# Patient Record
Sex: Female | Born: 1965 | Race: Black or African American | Hispanic: No | Smoking: Never smoker
Health system: Southern US, Community
[De-identification: ages and names within clinical notes are randomized; demographics above are authoritative.]

## PROBLEM LIST (undated history)

## (undated) DIAGNOSIS — I10 Essential (primary) hypertension: Secondary | ICD-10-CM

## (undated) HISTORY — PX: ABDOMINAL HYSTERECTOMY: SHX81

---

## 2010-11-13 ENCOUNTER — Inpatient Hospital Stay (INDEPENDENT_AMBULATORY_CARE_PROVIDER_SITE_OTHER)
Admission: RE | Admit: 2010-11-13 | Discharge: 2010-11-13 | Disposition: A | Discharge status: Home / Self Care | Payer: BC Managed Care – PPO | Source: Ambulatory Visit | Attending: Emergency Medicine | Admitting: Emergency Medicine

## 2010-11-13 DIAGNOSIS — J40 Bronchitis, not specified as acute or chronic: Secondary | ICD-10-CM

## 2010-11-13 DIAGNOSIS — J309 Allergic rhinitis, unspecified: Secondary | ICD-10-CM

## 2012-07-24 ENCOUNTER — Emergency Department (HOSPITAL_COMMUNITY): Payer: BC Managed Care – PPO

## 2012-07-24 ENCOUNTER — Inpatient Hospital Stay (HOSPITAL_COMMUNITY)
Admission: EM | Admit: 2012-07-24 | Discharge: 2012-07-28 | DRG: 089 | Disposition: A | Discharge status: Home / Self Care | Payer: BC Managed Care – PPO | Attending: Internal Medicine | Admitting: Internal Medicine

## 2012-07-24 ENCOUNTER — Encounter (HOSPITAL_COMMUNITY): Payer: Self-pay | Admitting: Family Medicine

## 2012-07-24 DIAGNOSIS — J984 Other disorders of lung: Secondary | ICD-10-CM | POA: Diagnosis present

## 2012-07-24 DIAGNOSIS — E669 Obesity, unspecified: Secondary | ICD-10-CM | POA: Diagnosis present

## 2012-07-24 DIAGNOSIS — F411 Generalized anxiety disorder: Secondary | ICD-10-CM | POA: Diagnosis present

## 2012-07-24 DIAGNOSIS — J189 Pneumonia, unspecified organism: Principal | ICD-10-CM | POA: Diagnosis present

## 2012-07-24 DIAGNOSIS — Z79899 Other long term (current) drug therapy: Secondary | ICD-10-CM

## 2012-07-24 DIAGNOSIS — I10 Essential (primary) hypertension: Secondary | ICD-10-CM | POA: Diagnosis present

## 2012-07-24 DIAGNOSIS — J383 Other diseases of vocal cords: Secondary | ICD-10-CM | POA: Diagnosis present

## 2012-07-24 DIAGNOSIS — R0603 Acute respiratory distress: Secondary | ICD-10-CM | POA: Diagnosis present

## 2012-07-24 DIAGNOSIS — E876 Hypokalemia: Secondary | ICD-10-CM | POA: Diagnosis present

## 2012-07-24 DIAGNOSIS — R0609 Other forms of dyspnea: Secondary | ICD-10-CM

## 2012-07-24 DIAGNOSIS — Z6841 Body Mass Index (BMI) 40.0 and over, adult: Secondary | ICD-10-CM

## 2012-07-24 DIAGNOSIS — R062 Wheezing: Secondary | ICD-10-CM | POA: Diagnosis present

## 2012-07-24 HISTORY — DX: Essential (primary) hypertension: I10

## 2012-07-24 LAB — CBC WITH DIFFERENTIAL/PLATELET
HCT: 38 % (ref 36.0–46.0)
Hemoglobin: 12.6 g/dL (ref 12.0–15.0)
Lymphs Abs: 2.4 10*3/uL (ref 0.7–4.0)
MCH: 25.5 pg — ABNORMAL LOW (ref 26.0–34.0)
Monocytes Absolute: 0.5 10*3/uL (ref 0.1–1.0)
Monocytes Relative: 11 % (ref 3–12)
Neutro Abs: 1.7 10*3/uL (ref 1.7–7.7)
Neutrophils Relative %: 35 % — ABNORMAL LOW (ref 43–77)
RBC: 4.94 MIL/uL (ref 3.87–5.11)

## 2012-07-24 LAB — BASIC METABOLIC PANEL
BUN: 7 mg/dL (ref 6–23)
Chloride: 102 mEq/L (ref 96–112)
Glucose, Bld: 93 mg/dL (ref 70–99)
Potassium: 3.3 mEq/L — ABNORMAL LOW (ref 3.5–5.1)

## 2012-07-24 LAB — TSH: TSH: 1.386 u[IU]/mL (ref 0.350–4.500)

## 2012-07-24 MED ORDER — IPRATROPIUM BROMIDE 0.02 % IN SOLN
1.0000 mg | Freq: Once | RESPIRATORY_TRACT | Status: AC
  Administered 2012-07-24: 1 mg via RESPIRATORY_TRACT
  Filled 2012-07-24: qty 5

## 2012-07-24 MED ORDER — ACETAMINOPHEN 325 MG PO TABS: 650.0000 mg | ORAL_TABLET | Freq: Four times a day (QID) | ORAL | Status: DC | PRN

## 2012-07-24 MED ORDER — ONDANSETRON HCL 4 MG/2ML IJ SOLN: 4.0000 mg | Freq: Three times a day (TID) | INTRAMUSCULAR | Status: DC | PRN

## 2012-07-24 MED ORDER — LORAZEPAM 1 MG PO TABS
1.0000 mg | ORAL_TABLET | Freq: Once | ORAL | Status: AC
  Administered 2012-07-24: 1 mg via ORAL
  Filled 2012-07-24: qty 1

## 2012-07-24 MED ORDER — SODIUM CHLORIDE 0.9 % IV SOLN: INTRAVENOUS | Status: DC

## 2012-07-24 MED ORDER — HEPARIN SODIUM (PORCINE) 5000 UNIT/ML IJ SOLN
5000.0000 [IU] | Freq: Three times a day (TID) | INTRAMUSCULAR | Status: DC
  Administered 2012-07-24 – 2012-07-28 (×11): 5000 [IU] via SUBCUTANEOUS
  Filled 2012-07-24 (×15): qty 1

## 2012-07-24 MED ORDER — ALBUTEROL SULFATE (5 MG/ML) 0.5% IN NEBU
5.0000 mg | INHALATION_SOLUTION | Freq: Once | RESPIRATORY_TRACT | Status: AC
  Administered 2012-07-24: 5 mg via RESPIRATORY_TRACT
  Filled 2012-07-24: qty 1

## 2012-07-24 MED ORDER — ACETAMINOPHEN 650 MG RE SUPP: 650.0000 mg | Freq: Four times a day (QID) | RECTAL | Status: DC | PRN

## 2012-07-24 MED ORDER — GUAIFENESIN-DM 100-10 MG/5ML PO SYRP
5.0000 mL | ORAL_SOLUTION | ORAL | Status: DC | PRN
  Administered 2012-07-27: 5 mL via ORAL
  Filled 2012-07-24 (×2): qty 5

## 2012-07-24 MED ORDER — HYDRALAZINE HCL 20 MG/ML IJ SOLN
5.0000 mg | Freq: Four times a day (QID) | INTRAMUSCULAR | Status: DC | PRN
  Filled 2012-07-24: qty 0.25

## 2012-07-24 MED ORDER — ALBUTEROL SULFATE (5 MG/ML) 0.5% IN NEBU
2.5000 mg | INHALATION_SOLUTION | Freq: Four times a day (QID) | RESPIRATORY_TRACT | Status: DC
  Administered 2012-07-24 – 2012-07-26 (×8): 2.5 mg via RESPIRATORY_TRACT
  Filled 2012-07-24 (×9): qty 0.5

## 2012-07-24 MED ORDER — METHYLPREDNISOLONE SODIUM SUCC 125 MG IJ SOLR
80.0000 mg | Freq: Three times a day (TID) | INTRAMUSCULAR | Status: DC
  Administered 2012-07-24 – 2012-07-25 (×3): 80 mg via INTRAVENOUS
  Filled 2012-07-24 (×4): qty 1.28
  Filled 2012-07-24: qty 2
  Filled 2012-07-24: qty 1.28
  Filled 2012-07-24: qty 2

## 2012-07-24 MED ORDER — SODIUM CHLORIDE 0.9 % IJ SOLN
3.0000 mL | Freq: Two times a day (BID) | INTRAMUSCULAR | Status: DC
  Administered 2012-07-25 – 2012-07-27 (×6): 3 mL via INTRAVENOUS

## 2012-07-24 MED ORDER — OXYCODONE HCL 5 MG PO TABS: 5.0000 mg | ORAL_TABLET | ORAL | Status: DC | PRN

## 2012-07-24 MED ORDER — LEVOFLOXACIN 750 MG PO TABS
750.0000 mg | ORAL_TABLET | Freq: Once | ORAL | Status: AC
  Administered 2012-07-24: 750 mg via ORAL
  Filled 2012-07-24: qty 1

## 2012-07-24 MED ORDER — MAGNESIUM SULFATE IN D5W 10-5 MG/ML-% IV SOLN
1.0000 g | Freq: Once | INTRAVENOUS | Status: AC
  Administered 2012-07-24: 1 g via INTRAVENOUS
  Filled 2012-07-24: qty 100

## 2012-07-24 MED ORDER — AMLODIPINE BESYLATE 5 MG PO TABS
5.0000 mg | ORAL_TABLET | Freq: Every day | ORAL | Status: DC
  Administered 2012-07-24 – 2012-07-25 (×2): 5 mg via ORAL
  Filled 2012-07-24 (×3): qty 1

## 2012-07-24 MED ORDER — SODIUM CHLORIDE 0.9 % IV SOLN
INTRAVENOUS | Status: DC
  Administered 2012-07-24: 16:00:00 via INTRAVENOUS

## 2012-07-24 MED ORDER — FUROSEMIDE 10 MG/ML IJ SOLN
40.0000 mg | Freq: Once | INTRAMUSCULAR | Status: AC
  Administered 2012-07-24: 40 mg via INTRAVENOUS
  Filled 2012-07-24: qty 4

## 2012-07-24 MED ORDER — OSELTAMIVIR PHOSPHATE 75 MG PO CAPS
75.0000 mg | ORAL_CAPSULE | Freq: Two times a day (BID) | ORAL | Status: DC
  Administered 2012-07-24 – 2012-07-25 (×2): 75 mg via ORAL
  Filled 2012-07-24 (×3): qty 1

## 2012-07-24 MED ORDER — HYDROCODONE-ACETAMINOPHEN 7.5-500 MG/15ML PO SOLN
10.0000 mL | Freq: Once | ORAL | Status: AC
  Administered 2012-07-24: 10 mL via ORAL
  Filled 2012-07-24: qty 15

## 2012-07-24 MED ORDER — MORPHINE SULFATE 2 MG/ML IJ SOLN
1.0000 mg | INTRAMUSCULAR | Status: DC | PRN
  Administered 2012-07-24 – 2012-07-25 (×2): 2 mg via INTRAVENOUS
  Filled 2012-07-24 (×2): qty 1

## 2012-07-24 MED ORDER — ALBUTEROL SULFATE (5 MG/ML) 0.5% IN NEBU
2.5000 mg | INHALATION_SOLUTION | RESPIRATORY_TRACT | Status: DC | PRN
  Administered 2012-07-24: 2.5 mg via RESPIRATORY_TRACT
  Filled 2012-07-24 (×2): qty 0.5

## 2012-07-24 MED ORDER — IPRATROPIUM BROMIDE 0.02 % IN SOLN
0.5000 mg | Freq: Once | RESPIRATORY_TRACT | Status: AC
  Administered 2012-07-24: 0.5 mg via RESPIRATORY_TRACT
  Filled 2012-07-24: qty 2.5

## 2012-07-24 MED ORDER — ONDANSETRON HCL 4 MG/2ML IJ SOLN: 4.0000 mg | Freq: Four times a day (QID) | INTRAMUSCULAR | Status: DC | PRN

## 2012-07-24 MED ORDER — GUAIFENESIN ER 600 MG PO TB12
1200.0000 mg | ORAL_TABLET | Freq: Two times a day (BID) | ORAL | Status: DC
  Administered 2012-07-24 – 2012-07-28 (×8): 1200 mg via ORAL
  Filled 2012-07-24 (×12): qty 2

## 2012-07-24 MED ORDER — ALBUTEROL (5 MG/ML) CONTINUOUS INHALATION SOLN
10.0000 mg/h | INHALATION_SOLUTION | RESPIRATORY_TRACT | Status: DC
  Administered 2012-07-24: 10 mg/h via RESPIRATORY_TRACT
  Filled 2012-07-24: qty 20

## 2012-07-24 MED ORDER — PREDNISONE 20 MG PO TABS
60.0000 mg | ORAL_TABLET | Freq: Once | ORAL | Status: AC
  Administered 2012-07-24: 60 mg via ORAL
  Filled 2012-07-24: qty 3

## 2012-07-24 MED ORDER — IPRATROPIUM BROMIDE 0.02 % IN SOLN
0.5000 mg | Freq: Four times a day (QID) | RESPIRATORY_TRACT | Status: DC
  Administered 2012-07-25 (×2): 0.5 mg via RESPIRATORY_TRACT
  Filled 2012-07-24 (×4): qty 2.5

## 2012-07-24 MED ORDER — LEVOFLOXACIN IN D5W 750 MG/150ML IV SOLN
750.0000 mg | INTRAVENOUS | Status: DC
  Administered 2012-07-25: 750 mg via INTRAVENOUS
  Filled 2012-07-24 (×2): qty 150

## 2012-07-24 MED ORDER — ONDANSETRON HCL 4 MG PO TABS: 4.0000 mg | ORAL_TABLET | Freq: Four times a day (QID) | ORAL | Status: DC | PRN

## 2012-07-24 MED ORDER — ALBUTEROL SULFATE (5 MG/ML) 0.5% IN NEBU: 2.5000 mg | INHALATION_SOLUTION | Freq: Four times a day (QID) | RESPIRATORY_TRACT | Status: DC | PRN

## 2012-07-24 MED ORDER — POTASSIUM CHLORIDE CRYS ER 20 MEQ PO TBCR
40.0000 meq | EXTENDED_RELEASE_TABLET | Freq: Four times a day (QID) | ORAL | Status: AC
  Administered 2012-07-24 – 2012-07-25 (×2): 40 meq via ORAL
  Filled 2012-07-24 (×2): qty 2

## 2012-07-24 NOTE — ED Notes (Signed)
MD at bedside. 

## 2012-07-24 NOTE — ED Notes (Signed)
Moved to Pod A-2

## 2012-07-24 NOTE — ED Notes (Signed)
Returned from XRAY

## 2012-07-24 NOTE — ED Notes (Signed)
Admitting MD returned page. Coming to see patient

## 2012-07-24 NOTE — ED Provider Notes (Signed)
MSE was initiated and I personally evaluated the patient and placed orders (if any) at  2:12 PM on July 24, 2012.  Patient with grater than one week of cough, shortness of breath and wheezing. She is without history of asthma, medical history of hypertension only.  On my exam patient withshortness of breath, speaking in one to 2 word sentences and wheezing significantly.  CXR ordered along with a second albuterol treatment.  The patient appears stable so that the remainder of the MSE may be completed by another provider.   Dahlia Client Mardie Kellen, PA-C 07/24/12 1415

## 2012-07-24 NOTE — ED Notes (Signed)
Per pt sts cough, bronchitis, SOB x 1 week.

## 2012-07-24 NOTE — ED Notes (Signed)
Neb tx stopped due to pt HR increasing to 138. Pt experienced no relief with tx. Diminished inspiratory and expiatory wheezes auscultated bilaterally. Pt speaking in complete sentences. 99% RA.

## 2012-07-24 NOTE — ED Provider Notes (Signed)
History     CSN: [id number]  Arrival date & time 07/24/12  1315   First MD Initiated Contact with Patient 07/24/12 1410      Chief Complaint  Patient presents with  . Bronchitis    (Consider location/radiation/quality/duration/timing/severity/associated sxs/prior treatment) HPI  47 year old female with history of hypertension presents complaining of cough and shortness of breath. Patient reports for the past week she has had gradual onset of nonproductive cough, shortness of breath, and nasal congestion. She endorses posttussive emesis, and pleuritic chest pain. Symptom has been persistence, moderate in severity, not improved with home medication. She denies fever, chills, abdominal pain, back pain, or rash. She is a nonsmoker. She has no prior history of asthma or COPD. She denies any recent sick contact. Patient denies any recent surgery, prolonged bed rest, using birth control pill, prior history of PE or DVT, calf swelling or leg pain. There has been no other environmental changes.  Past Medical History  Diagnosis Date  . Hypertension     Past Surgical History  Procedure Date  . Abdominal hysterectomy     History reviewed. No pertinent family history.  History  Substance Use Topics  . Smoking status: Never Smoker   . Smokeless tobacco: Not on file  . Alcohol Use: No    OB History    Grav Para Term Preterm Abortions TAB SAB Ect Mult Living                  Review of Systems  Constitutional:       10 Systems reviewed and all are negative for acute change except as noted in the HPI.     Allergies  Penicillins  Home Medications   Current Outpatient Rx  Name  Route  Sig  Dispense  Refill  . LISINOPRIL-HYDROCHLOROTHIAZIDE PO   Oral   Take 2 tablets by mouth 2 (two) times daily.         Lenn Sink ALLERGY/COUGH PO   Oral   Take 30 mLs by mouth every 4 (four) hours as needed. For cough/cold           BP 164/109  Pulse 92  Temp 98.7 F (37.1 C)  (Oral)  Resp 20  SpO2 97%  Physical Exam  Nursing note and vitals reviewed. Constitutional: She appears well-developed and well-nourished. No distress.       Awake, alert, nontoxic appearance  HENT:  Head: Atraumatic.  Right Ear: External ear normal.  Left Ear: External ear normal.  Nose: Nose normal.  Mouth/Throat: Oropharynx is clear and moist. No oropharyngeal exudate.  Eyes: Conjunctivae normal are normal. Right eye exhibits no discharge. Left eye exhibits no discharge.  Neck: Neck supple. No JVD present.  Cardiovascular: Normal rate and regular rhythm.  Exam reveals no gallop and no friction rub.   No murmur heard. Pulmonary/Chest: She is in respiratory distress (patient with audible wheezing, speaking in 3-5 words per sentences, but without accessory muscle use.). She has wheezes. She has no rales. She exhibits no tenderness.  Abdominal: Soft. There is no tenderness. There is no rebound.  Musculoskeletal: She exhibits no edema and no tenderness.       ROM appears intact, no obvious focal weakness  Lymphadenopathy:    She has no cervical adenopathy.  Neurological:       Mental status and motor strength appears intact  Skin: No rash noted.  Psychiatric: She has a normal mood and affect.    ED Course  Procedures (including critical care  time)   Labs Reviewed  CBC WITH DIFFERENTIAL  BASIC METABOLIC PANEL   No results found.   No diagnosis found.  Results for orders placed during the hospital encounter of 07/24/12  CBC WITH DIFFERENTIAL      Component Value Range   WBC 4.9  4.0 - 10.5 K/uL   RBC 4.94  3.87 - 5.11 MIL/uL   Hemoglobin 12.6  12.0 - 15.0 g/dL   HCT 16.1  09.6 - 04.5 %   MCV 76.9 (*) 78.0 - 100.0 fL   MCH 25.5 (*) 26.0 - 34.0 pg   MCHC 33.2  30.0 - 36.0 g/dL   RDW 40.9  81.1 - 91.4 %   Platelets 206  150 - 400 K/uL   Neutrophils Relative 35 (*) 43 - 77 %   Neutro Abs 1.7  1.7 - 7.7 K/uL   Lymphocytes Relative 49 (*) 12 - 46 %   Lymphs Abs 2.4   0.7 - 4.0 K/uL   Monocytes Relative 11  3 - 12 %   Monocytes Absolute 0.5  0.1 - 1.0 K/uL   Eosinophils Relative 4  0 - 5 %   Eosinophils Absolute 0.2  0.0 - 0.7 K/uL   Basophils Relative 1  0 - 1 %   Basophils Absolute 0.0  0.0 - 0.1 K/uL  BASIC METABOLIC PANEL      Component Value Range   Sodium 139  135 - 145 mEq/L   Potassium 3.3 (*) 3.5 - 5.1 mEq/L   Chloride 102  96 - 112 mEq/L   CO2 25  19 - 32 mEq/L   Glucose, Bld 93  70 - 99 mg/dL   BUN 7  6 - 23 mg/dL   Creatinine, Ser 7.82  0.50 - 1.10 mg/dL   Calcium 9.2  8.4 - 95.6 mg/dL   GFR calc non Af Amer >90  >90 mL/min   GFR calc Af Amer >90  >90 mL/min   Dg Chest 2 View  07/24/2012  *RADIOLOGY REPORT*  Clinical Data: Wheezing.  Shortness of breath.  CHEST - 2 VIEW  Comparison: None.  Findings: Indistinct airspace opacity observed in the right upper lobe.  There is mild tortuosity of the thoracic aorta.  Heart size within normal limits.  Thoracic spondylosis is present.  No pleural effusion identified.  IMPRESSION:  1.  Indistinct airspace opacity in the right upper lobe, favoring early pneumonia.  Follow-up chest radiography in 4-6 weeks time is recommended to ensure clearance and exclude the unlikely possibility of malignancy. 2.  Thoracic spondylosis.   Original Report Authenticated By: Gaylyn Rong, M.D.     1. Community acquired pneumonia  MDM  Pt presents with URI sxs x 1 week. She is audibly wheezing, and in mild resp distress.  Has received 2 albuterol/atrovent treatment without improvement.  Will start continuous nebs, prednisone, obtain CXR and will continue to monitor.  No prior hx of asthma or COPD.  PERC negative.   3:30 PM Chest x-ray reviewed by me shows an indistinct airspace opacity in the right upper lobe favor an early pneumonia. Result discussed with patient.  Will treat pt with Levaquin.    4:31 PM Pt is hypertensive, tachypneic, and tachycardic with no improvement after receiving 2 albuterol/atrovent  treatment and hr long nebs.  Magnesium given, prednisone given.  My attending has also evaluated pt.  We both felt pt will benefit from admission for further management of her infection.    4:56 PM i have consulted with  [facility], who agrees to see pt in ER.  Request pt to be placed on obs, Team 3, tele.    Fayrene Helper, PA-C 07/25/12 1458

## 2012-07-24 NOTE — H&P (Signed)
[facility] History and Physical  Pammie Searles WGN:[id number] DOB: Jan 16, 1966 DOA: 07/24/2012  Referring physician:[place] PCP: Richelle Ito, FNP with a [facility], [place], [place]  Chief Complaint:   HPI: Grace Barker is a 47 y.o. female with past medical history only significant for hypertension and obesity. She came in to the hospital because of shortness of breath. 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 She said she's been having shortness of breath for the past 2-3 weeks, with followup respiratory symptoms including cough, wheezing and phlegm production. Symptoms comes and goes but for the past several days has been worsening. Patient reaches a point that she couldn't breathe well so she came in to the emergency department for further medical evaluation. When she was evaluated in the ED the intention was to discharge her home after she got levofloxacin, inhaled albuterol and steroids. But patient continues to have shortness of breath and respiratory distress she couldn't even complete sentences when she is talking. She never had history of COPD or asthma. She never been intubated before. Patient referred to that hospital for admission after the chest x-ray showed right upper lobe pneumonia.   Review of Systems:  Constitutional: negative for anorexia, fevers and sweats Eyes: negative for irritation, redness and visual disturbance Ears, nose, mouth, throat, and face: negative for earaches, epistaxis, nasal congestion and sore throat Respiratory: negative for cough, dyspnea on exertion, sputum and wheezing Cardiovascular: negative for chest pain, dyspnea, lower extremity edema, orthopnea, palpitations and syncope Gastrointestinal: negative for abdominal pain, constipation, diarrhea, melena, nausea and vomiting Genitourinary:negative for dysuria, frequency and hematuria Hematologic/lymphatic: negative for bleeding, easy bruising and  lymphadenopathy Musculoskeletal:negative for arthralgias, muscle weakness and stiff joints Neurological: negative for coordination problems, gait problems, headaches and weakness Endocrine: negative for diabetic symptoms including polydipsia, polyuria and weight loss Allergic/Immunologic: negative for anaphylaxis, hay fever and urticaria   Past Medical History  Diagnosis Date  . Hypertension    Past Surgical History  Procedure Date  . Abdominal hysterectomy    Social History:  reports that she has never smoked. She does not have any smokeless tobacco history on file. She reports that she does not drink alcohol. Her drug history not on file.   Allergies  Allergen Reactions  . Penicillins Rash    Family History  Problem Relation Age of Onset  . Diabetes Mother      Prior to Admission medications   Medication Sig Start Date End Date Taking? Authorizing Provider  LISINOPRIL-HYDROCHLOROTHIAZIDE PO Take 2 tablets by mouth 2 (two) times daily.   Yes Historical Provider, MD  Pseudoeph-Bromphen-DM (ROBITUSSIN ALLERGY/COUGH PO) Take 30 mLs by mouth every 4 (four) hours as needed. For cough/cold   Yes Historical Provider, MD   Physical Exam: Filed Vitals:   07/24/12 1340 07/24/12 1530 07/24/12 1841  BP: 164/109 158/86 160/85  Pulse: 92 101 107  Temp: 98.7 F (37.1 C)  97.7 F (36.5 C)  TempSrc: Oral    Resp: 20  24  SpO2: 97% 100% 99%   General appearance: alert, cooperative and no distress  Head: Normocephalic, without obvious abnormality, atraumatic  Eyes: conjunctivae/corneas clear. PERRL, EOM's intact. Fundi benign.  Nose: Nares normal. Septum midline. Mucosa normal. No drainage or sinus tenderness.  Throat: lips, mucosa, and tongue normal; teeth and gums normal  Neck: Supple, no masses, no cervical lymphadenopathy, no JVD appreciated, no meningeal signs Resp: Bilateral wheezing Chest wall: no tenderness  Cardio: regular rate and rhythm, S1, S2 normal,  no murmur, click,  rub or gallop  GI: soft, non-tender; bowel sounds normal; no masses, no organomegaly  Extremities: extremities normal, atraumatic, no cyanosis or edema  Skin: Skin color, texture, turgor normal. No rashes or lesions  Neurologic: Alert and oriented X 3, normal strength and tone. Normal symmetric reflexes. Normal coordination and gait  Labs on Admission:  Basic Metabolic Panel:  Lab 07/24/12 1610  NA 139  K 3.3*  CL 102  CO2 25  GLUCOSE 93  BUN 7  CREATININE 0.75  CALCIUM 9.2  MG --  PHOS --   Liver Function Tests: No results found for this basename: AST:5,ALT:5,ALKPHOS:5,BILITOT:5,PROT:5,ALBUMIN:5 in the last 168 hours No results found for this basename: LIPASE:5,AMYLASE:5 in the last 168 hours No results found for this basename: AMMONIA:5 in the last 168 hours CBC:  Lab 07/24/12 1525  WBC 4.9  NEUTROABS 1.7  HGB 12.6  HCT 38.0  MCV 76.9*  PLT 206   Cardiac Enzymes: No results found for this basename: CKTOTAL:5,CKMB:5,CKMBINDEX:5,TROPONINI:5 in the last 168 hours  BNP (last 3 results) No results found for this basename: PROBNP:3 in the last 8760 hours CBG: No results found for this basename: GLUCAP:5 in the last 168 hours  Radiological Exams on Admission: Dg Chest 2 View  07/24/2012  *RADIOLOGY REPORT*  Clinical Data: Wheezing.  Shortness of breath.  CHEST - 2 VIEW  Comparison: None.  Findings: Indistinct airspace opacity observed in the right upper lobe.  There is mild tortuosity of the thoracic aorta.  Heart size within normal limits.  Thoracic spondylosis is present.  No pleural effusion identified.  IMPRESSION:  1.  Indistinct airspace opacity in the right upper lobe, favoring early pneumonia.  Follow-up chest radiography in 4-6 weeks time is recommended to ensure clearance and exclude the unlikely possibility of malignancy. 2.  Thoracic spondylosis.   Original Report Authenticated By: Gaylyn Rong, M.D.     EKG: Independently  reviewed.  Assessment/Plan Principal Problem:  *CAP (community acquired pneumonia) Active Problems:  HTN (hypertension)  Respiratory distress  Wheezing   Pneumonia -Community acquired pneumonia, right upper lobe. -Patient started on Levaquin, supportive management with antitussives, mucolytics and oxygen as needed. -Patient did not report fever, but because of the severity of the symptoms I will rule out flu. -Started empirically on Tamiflu till the flu PCR comes back.  Respiratory distress -Patient is wheezing, she is using her accessory muscles for breathing. -As mentioned above she does not have history of intubation, COPD or asthma. -She is wheezing and, I placed her on inhaled bronchodilators and I will start IV Solu-Medrol. -Her oxygen saturation is above 97% on room air. Is not hypoxic or having chest pain, low probability for PE.  Hypertension -Patient is on lisinopril and hydrochlorothiazide. I will hold both, lisinopril can cause chronic cough -She does have trace pedal edema I gave her one dose of Lasix I started amlodipine.  Hypokalemia -Replete with oral supplementation.  Code Status: Full code Family Communication: Discussed with wife, son and daughter-in-law at bedside Disposition Plan: Inpatient, telemetry  Time spent: 70 minutes  [facility] Pager [phone]  If 7PM-7AM, please contact night-coverage [email or web address] Password [facility] 07/24/2012, 6:49 PM

## 2012-07-24 NOTE — ED Notes (Signed)
Patient transported to X-ray 

## 2012-07-24 NOTE — ED Provider Notes (Signed)
Medical screening examination/treatment/procedure(s) were performed by non-physician practitioner and as supervising physician I was immediately available for consultation/collaboration.   Daveena Elmore, MD 07/24/12 1657 

## 2012-07-24 NOTE — ED Notes (Signed)
Breathing tx interupted for xray.

## 2012-07-25 LAB — BASIC METABOLIC PANEL
BUN: 13 mg/dL (ref 6–23)
CO2: 22 mEq/L (ref 19–32)
Calcium: 9.5 mg/dL (ref 8.4–10.5)
Chloride: 102 mEq/L (ref 96–112)
Creatinine, Ser: 0.76 mg/dL (ref 0.50–1.10)

## 2012-07-25 LAB — CBC
HCT: 39 % (ref 36.0–46.0)
MCH: 25 pg — ABNORMAL LOW (ref 26.0–34.0)
MCHC: 32.6 g/dL (ref 30.0–36.0)
MCV: 76.9 fL — ABNORMAL LOW (ref 78.0–100.0)
Platelets: 225 10*3/uL (ref 150–400)
RDW: 15.4 % (ref 11.5–15.5)
WBC: 6.6 10*3/uL (ref 4.0–10.5)

## 2012-07-25 LAB — BLOOD GAS, ARTERIAL
Acid-base deficit: 4 mmol/L — ABNORMAL HIGH (ref 0.0–2.0)
Bicarbonate: 20.6 mEq/L (ref 20.0–24.0)
O2 Saturation: 98.3 %
Patient temperature: 98.6
TCO2: 21.8 mmol/L (ref 0–100)

## 2012-07-25 LAB — INFLUENZA PANEL BY PCR (TYPE A & B): H1N1 flu by pcr: NOT DETECTED

## 2012-07-25 LAB — HEMOGLOBIN A1C: Mean Plasma Glucose: 117 mg/dL — ABNORMAL HIGH (ref <117)

## 2012-07-25 MED ORDER — CHLORHEXIDINE GLUCONATE CLOTH 2 % EX PADS
6.0000 | MEDICATED_PAD | Freq: Every day | CUTANEOUS | Status: DC
  Administered 2012-07-25 – 2012-07-28 (×4): 6 via TOPICAL

## 2012-07-25 MED ORDER — ALPRAZOLAM 0.5 MG PO TABS
0.5000 mg | ORAL_TABLET | Freq: Three times a day (TID) | ORAL | Status: DC | PRN
  Administered 2012-07-25 – 2012-07-27 (×3): 0.5 mg via ORAL
  Filled 2012-07-25 (×3): qty 1

## 2012-07-25 MED ORDER — LEVOFLOXACIN IN D5W 750 MG/150ML IV SOLN: 750.0000 mg | INTRAVENOUS | Status: DC

## 2012-07-25 MED ORDER — BACITRACIN ZINC 500 UNIT/GM EX OINT
TOPICAL_OINTMENT | Freq: Two times a day (BID) | CUTANEOUS | Status: DC
  Administered 2012-07-25 – 2012-07-28 (×7): via TOPICAL
  Filled 2012-07-25 (×3): qty 15

## 2012-07-25 MED ORDER — AMLODIPINE BESYLATE 10 MG PO TABS
10.0000 mg | ORAL_TABLET | Freq: Every day | ORAL | Status: DC
  Administered 2012-07-26 – 2012-07-28 (×3): 10 mg via ORAL
  Filled 2012-07-25 (×3): qty 1

## 2012-07-25 MED ORDER — VANCOMYCIN HCL IN DEXTROSE 1-5 GM/200ML-% IV SOLN
1000.0000 mg | Freq: Two times a day (BID) | INTRAVENOUS | Status: DC
  Administered 2012-07-26: 1000 mg via INTRAVENOUS
  Filled 2012-07-25 (×3): qty 200

## 2012-07-25 MED ORDER — LORAZEPAM 2 MG/ML IJ SOLN
0.5000 mg | Freq: Once | INTRAMUSCULAR | Status: AC
  Administered 2012-07-25: 0.5 mg via INTRAVENOUS

## 2012-07-25 MED ORDER — METHYLPREDNISOLONE SODIUM SUCC 125 MG IJ SOLR
60.0000 mg | Freq: Two times a day (BID) | INTRAMUSCULAR | Status: DC
  Administered 2012-07-26: 60 mg via INTRAVENOUS
  Filled 2012-07-25 (×3): qty 0.96

## 2012-07-25 MED ORDER — LORAZEPAM 2 MG/ML IJ SOLN
INTRAMUSCULAR | Status: AC
  Administered 2012-07-25: 0.5 mg via INTRAVENOUS
  Filled 2012-07-25: qty 1

## 2012-07-25 MED ORDER — DEXTROSE 5 % IV SOLN
2.0000 g | Freq: Three times a day (TID) | INTRAVENOUS | Status: DC
  Administered 2012-07-25 – 2012-07-26 (×3): 2 g via INTRAVENOUS
  Filled 2012-07-25 (×5): qty 2

## 2012-07-25 MED ORDER — VANCOMYCIN HCL 10 G IV SOLR
2000.0000 mg | Freq: Once | INTRAVENOUS | Status: AC
  Administered 2012-07-25: 2000 mg via INTRAVENOUS
  Filled 2012-07-25: qty 2000

## 2012-07-25 NOTE — Progress Notes (Signed)
ANTIBIOTIC CONSULT NOTE - INITIAL  Pharmacy Consult:  Vancomycin Indication:  Suspected PNA  Allergies  Allergen Reactions  . Penicillins Rash    Patient Measurements: Height: 5\' 4"  (162.6 cm) Weight: 242 lb 8.1 oz (110 kg) IBW/kg (Calculated) : 54.7   Vital Signs: Temp: 98 F (36.7 C) (01/11 1200) Temp src: Oral (01/11 1200) BP: 133/81 mmHg (01/11 1200) Pulse Rate: 101  (01/11 0455) Intake/Output from previous day: 01/10 0701 - 01/11 0700 In: 560 [P.O.:560] Out: 800 [Urine:800] Intake/Output from this shift: Total I/O In: 483 [P.O.:480; I.V.:3] Out: -   Labs:  Basename 07/25/12 0550 07/24/12 1525  WBC 6.6 4.9  HGB 12.7 12.6  PLT 225 206  LABCREA -- --  CREATININE 0.76 0.75   Estimated Creatinine Clearance: 107.7 ml/min (by C-G formula based on Cr of 0.76). No results found for this basename: VANCOTROUGH:2,VANCOPEAK:2,VANCORANDOM:2,GENTTROUGH:2,GENTPEAK:2,GENTRANDOM:2,TOBRATROUGH:2,TOBRAPEAK:2,TOBRARND:2,AMIKACINPEAK:2,AMIKACINTROU:2,AMIKACIN:2, in the last 72 hours   Microbiology: Recent Results (from the past 720 hour(s))  MRSA PCR SCREENING     Status: Abnormal   Collection Time   07/25/12  4:45 AM      Component Value Range Status Comment   MRSA by PCR POSITIVE (*) NEGATIVE Final     Medical History: Past Medical History  Diagnosis Date  . Hypertension        Assessment: 89 YOF with chief complaints of shortness of breath for the past 2-3 weeks, cough, wheezing, and sputum production.  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  Pharmacy consulted to manage vancomycin.  She remains on levofloxacin and aztreonam.  Patient with good renal function.   Goal of Therapy:  Vancomycin trough level 15-20 mcg/ml   Plan:  - Vanc 2gm IV x 1, then 1gm IV Q12H - Continue Levaquin and Azactam as ordered - Monitor renal fxn, clinical course, vanc trough as indicated     Ghina Bittinger D. Laney Potash, PharmD, BCPS Pager:  [phone] 07/25/2012, 3:18  PM

## 2012-07-25 NOTE — Progress Notes (Signed)
[facility] Progress Note [place] TEAM 1 - Stepdown/ICU TEAM   Grace Barker ZOX:[id number] DOB: 1965/09/17 DOA: 07/24/2012 PCP: No primary provider on file.  Brief narrative: 47 y.o. female with past medical history only significant for hypertension and obesity. She came in to the hospital because of shortness of breath. 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 She said she's been having shortness of breath for the past 2-3 weeks, with followup respiratory symptoms including cough, wheezing and phlegm production. Symptoms come and go but for the past several days had been worsening. Patient reached a point that she couldn't breathe well so she came in to the emergency department for further medical evaluation. When she was evaluated in the ED the intention was to discharge her home after she got levofloxacin, inhaled albuterol and steroids. But patient continued to have shortness of breath and respiratory distress - she couldn't even complete sentences when she was talking. She never had history of COPD or asthma. She has never been intubated before.   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) Influenza screen negative - will broaden abx to provide HCAP coverage in that pt works in SNF  Acute respiratory distress - bronchospastic  ?component of vocal cord dysfxn +/- anxiety - low dose anxiolytic - maximal PPI tx for possible reflux assoc vocal cord irritation - cont nebs - follow sx - no evidence of signif CO2 retention on ABG  HTN BP reasonably controlled at this time - holding ACE and diuretic during acute illness  Hypokalemia Due to diuretic - resolved  Obesity  MRSA screen + Contact precautions   Code Status: FULL Family Communication: spoke w/ pt and boyfriend at bedside Disposition Plan: SDU  Consultants: none  Procedures: none  Antibiotics: Levaquin 07/24/2012 >> Maxipime 07/25/12 >> Vanc 07/25/12 >>  DVT  prophylaxis: Sub Q heparin  HPI/Subjective: Pt is wheezing.  She reports feeling "no better."  She is ale to complete full sentences.  RN reports that wheezing has waxed and waned, though pt states it has been "constant all day."  She denies n/v, abdom pain, or chest pain.  She admits to feeling very anxious.     Objective: Blood pressure 133/81, pulse 101, temperature 98 F (36.7 C), temperature source Oral, resp. rate 23, height 5\' 4"  (1.626 m), weight 110 kg (242 lb 8.1 oz), SpO2 98.00%.  Intake/Output Summary (Last 24 hours) at 07/25/12 1421 Last data filed at 07/25/12 1200  Gross per 24 hour  Intake   1043 ml  Output    800 ml  Net    243 ml     Exam: General: tachypneic w/ audible exp wheeze  Lungs: no focal crackles - wheeze appears most c/w upper air way wheeze  Cardiovascular: tachycardic but regular - no gallup or rub  Abdomen: obese, nontender, nondistended, soft, bowel sounds positive, no rebound, no ascites, no appreciable mass Extremities: No significant cyanosis, clubbing, or edema bilateral lower extremities  Data Reviewed: Basic Metabolic Panel:  Lab 07/25/12 8119 07/24/12 1525  NA 138 139  K 4.1 3.3*  CL 102 102  CO2 22 25  GLUCOSE 167* 93  BUN 13 7  CREATININE 0.76 0.75  CALCIUM 9.5 9.2  MG -- --  PHOS -- --   CBC:  Lab 07/25/12 0550 07/24/12 1525  WBC 6.6 4.9  NEUTROABS -- 1.7  HGB 12.7 12.6  HCT 39.0 38.0  MCV 76.9* 76.9*  PLT 225 206  Recent Results (from the past 240 hour(s))  MRSA PCR SCREENING     Status: Abnormal   Collection Time   07/25/12  4:45 AM      Component Value Range Status Comment   MRSA by PCR POSITIVE (*) NEGATIVE Final      Studies:  Recent x-ray studies have been reviewed in detail by the Attending Physician  Scheduled Meds:  Reviewed in detail by the Attending Physician   Lonia Blood, MD [facility] Office  [phone] Pager [phone]  On-Call/Text Page:      [email or web address]       password [email or web address]  If 7PM-7AM, please contact night-coverage [email or web address] Password [email or web address] 07/25/2012, 2:21 PM   LOS: 1 day

## 2012-07-25 NOTE — Progress Notes (Signed)
Grace Barker [birth year] [id number]  Brief Narrative: 46yo with pmh of hypertension and obesity admitted earlier this evening for cap and respiratory distress. Pt has no hx of asthma, COPD or smoking. She was unable to be discharged home from ED due to persistent wheezing despite prolonged nebulizer tx, IV magnesium sulfate and IV steroids. Was called to floor by RN because of continued wheezing and tachypnea.  Subjective: Pt states she feels 'about the same' from time of admission. She has multiple visitors in the room with whom she is talking. She denies any chest pain.  Objective: Filed Vitals:   07/24/12 2336  BP: 173/88  Pulse: 112  Temp: 97.3 F (36.3 C)  Resp: 24   General: awake, alert sitting upright in bed. Able to complete sentences without increased dyspnea CV: S1S2 RRR, no m/r/g Resp: diffuse expiratory wheeze, slightly tachypneic, no crackles GI: abdomen soft, NT/ND, BS+ Ext: no c/c/e Psych: AOx3, pleasant mood and affect  A/P:  1. Respiratory distress/CAP: Again, patient able to complete sentences without worsening symptoms. VSS. Pt is nontoxic appearing. Visitors have been asked to allow pt to rest. Will continue nebs q2h, IV steroids, empiric Levaquin. No evidence of volume overload on exam. No need to transfer to unit at this time. Continue to monitor.   Cordelia Pen, NP-C [facility] Service [facility]  pgr [phone]   Adendum @ 0330: Pt reporting increased sob and anxiety. Remains tachypneic. Will transfer to SDU for closer monitoring, check ABG. Would like to get CT chest, but will hold off for now until pt more comfortable.

## 2012-07-25 NOTE — Progress Notes (Signed)
Continues to c/o difficulty breathing "chest feels tight" Resp. 36/min. Expiratory wheezing heard throughout. Notified Belia Heman NP. Orders received. Will continue monitoring.

## 2012-07-26 ENCOUNTER — Inpatient Hospital Stay (HOSPITAL_COMMUNITY): Payer: BC Managed Care – PPO

## 2012-07-26 LAB — BASIC METABOLIC PANEL
BUN: 15 mg/dL (ref 6–23)
Chloride: 102 mEq/L (ref 96–112)
GFR calc Af Amer: >90 mL/min (ref >90)
GFR calc non Af Amer: >90 mL/min (ref >90)
Glucose, Bld: 136 mg/dL — ABNORMAL HIGH (ref 70–99)
Potassium: 4.6 mEq/L (ref 3.5–5.1)
Sodium: 136 mEq/L (ref 135–145)

## 2012-07-26 MED ORDER — PANTOPRAZOLE SODIUM 40 MG PO TBEC
40.0000 mg | DELAYED_RELEASE_TABLET | Freq: Two times a day (BID) | ORAL | Status: DC
  Administered 2012-07-26 – 2012-07-28 (×4): 40 mg via ORAL
  Filled 2012-07-26 (×4): qty 1

## 2012-07-26 MED ORDER — LORAZEPAM 2 MG/ML IJ SOLN
0.5000 mg | Freq: Once | INTRAMUSCULAR | Status: AC
  Administered 2012-07-26: 0.5 mg via INTRAVENOUS
  Filled 2012-07-26: qty 1

## 2012-07-26 MED ORDER — LABETALOL HCL 100 MG PO TABS
100.0000 mg | ORAL_TABLET | Freq: Two times a day (BID) | ORAL | Status: DC
  Administered 2012-07-26 – 2012-07-28 (×5): 100 mg via ORAL
  Filled 2012-07-26 (×6): qty 1

## 2012-07-26 MED ORDER — LIDOCAINE HCL 4 % IJ SOLN
5.0000 mL | Freq: Once | INTRAMUSCULAR | Status: AC
  Administered 2012-07-27: 5 mL
  Filled 2012-07-26: qty 5

## 2012-07-26 MED ORDER — FAMOTIDINE 40 MG PO TABS
40.0000 mg | ORAL_TABLET | Freq: Every day | ORAL | Status: DC
  Administered 2012-07-26 – 2012-07-28 (×3): 40 mg via ORAL
  Filled 2012-07-26 (×3): qty 1

## 2012-07-26 MED ORDER — RACEPINEPHRINE HCL 2.25 % IN NEBU
0.5000 mL | INHALATION_SOLUTION | Freq: Once | RESPIRATORY_TRACT | Status: AC
  Administered 2012-07-26: 0.5 mL via RESPIRATORY_TRACT
  Filled 2012-07-26: qty 0.5

## 2012-07-26 NOTE — ED Provider Notes (Signed)
Medical screening examination/treatment/procedure(s) were conducted as a shared visit with non-physician practitioner(s) and myself.  I personally evaluated the patient during the encounter Pt with prod cough, wheezing, sob, subj fever.  Wheezing bil, upper resp congestion, few scat rales. Persistent dyspnea/wheezing, will admit.   Suzi Roots, MD 07/26/12 [phone]

## 2012-07-26 NOTE — Progress Notes (Signed)
Patient being transferred to unit 5500 per MD order. Reportr called to [employer], RN, patient will transfer in wheel chair.

## 2012-07-26 NOTE — Progress Notes (Addendum)
[facility] Progress Note [place] TEAM 1 - Stepdown/ICU TEAM   Brei Pidgeon ZOX:[id number] DOB: [birth year] DOA: 07/24/2012 PCP: No primary provider on file.  Brief narrative: 47 y.o. female with past medical history only significant for hypertension and obesity. She came in to the hospital because of shortness of breath. 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 She said she's been having shortness of breath for the past 2-3 weeks, with followup respiratory symptoms including cough, wheezing and phlegm production. Symptoms come and go but for the past several days had been worsening. Patient reached a point that she couldn't breathe well so she came in to the emergency department for further medical evaluation. When she was evaluated in the ED the intention was to discharge her home after she got levofloxacin, inhaled albuterol and steroids. But patient continued to have shortness of breath and respiratory distress - she couldn't even complete sentences when she was talking. She never had history of COPD or asthma. She has never been intubated before.   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) Influenza screen negative - broadened abx yesterday to provide HCAP coverage in that pt works in SNF - f/u CXR today is w/o focal infiltrate - pt is afebrile and WBC count is normal, even on steroids - will d/c abx as I am no longer convinced she is suffering with a pulmonary infection  Acute respiratory distress - bronchospastic - ?Vocal Cord Instability Syndrome ?component of vocal cord dysfxn +/- anxiety - low dose anxiolytic - maximal PPI tx for possible reflux assoc vocal cord irritation - no evidence of signif CO2 retention on ABG - pt feels nebs "make it worse" - discussed w/ Pulmonologist - will maximize GERD tx, avoid ACE/ARB, d/c systemic and inhales steroids, and complete w/u to r/o sinus disease - would benefit from outpt spirometry once  back to baseline  HTN BP trending upward - avoid ACE/ARB due to concern for vocal cord dysfunction - add BB and follow  Hypokalemia Due to diuretic - resolved  Obesity  MRSA screen + Contact precautions   Code Status: FULL Family Communication: spoke w/ pt Disposition Plan: transfer to medical bed  Consultants: none  Procedures: none  Antibiotics: Levaquin 07/24/2012 >>1/12 Maxipime 07/25/12 >>1/12 Vanc 07/25/12 >>1/12  DVT prophylaxis: Sub Q heparin  HPI/Subjective: Pt continues to exhibit wheeze.  Wheeze is not present when sleeping, but returns as soon as pt awakens.  She feels that nebs make it worse.  She denies chest pain, n/v, or abdom pain.     Objective: Blood pressure 147/79, pulse 93, temperature 98 F (36.7 C), temperature source Oral, resp. rate 23, height 5\' 4"  (1.626 m), weight 110 kg (242 lb 8.1 oz), SpO2 95.00%.  Intake/Output Summary (Last 24 hours) at 07/26/12 0955 Last data filed at 07/26/12 0800  Gross per 24 hour  Intake   1533 ml  Output      0 ml  Net   1533 ml     Exam: General: tachypneic w/ audible exp wheeze  Lungs: no focal crackles - wheeze appears most c/w upper air way wheeze w/ clear air movement in periph fields to auscultation  Cardiovascular: tachycardic but regular - no gallup or rub  Abdomen: obese, nontender, nondistended, soft, bowel sounds positive, no rebound, no ascites, no appreciable mass Extremities: No significant cyanosis, clubbing, or edema bilateral lower extremities  Data Reviewed: Basic Metabolic Panel:  Lab 07/26/12 8119 07/25/12 0550 07/24/12 1525  NA 136 138 139  K 4.6 4.1 3.3*  CL 102 102 102  CO2 24 22 25   GLUCOSE 136* 167* 93  BUN 15 13 7   CREATININE 0.74 0.76 0.75  CALCIUM 9.8 9.5 9.2  MG -- -- --  PHOS -- -- --   CBC:  Lab 07/25/12 0550 07/24/12 1525  WBC 6.6 4.9  NEUTROABS -- 1.7  HGB 12.7 12.6  HCT 39.0 38.0  MCV 76.9* 76.9*  PLT 225 206    Recent Results (from the past 240  hour(s))  MRSA PCR SCREENING     Status: Abnormal   Collection Time   07/25/12  4:45 AM      Component Value Range Status Comment   MRSA by PCR POSITIVE (*) NEGATIVE Final      Studies:  Recent x-ray studies have been reviewed in detail by the Attending Physician  Scheduled Meds:  Reviewed in detail by the Attending Physician   Lonia Blood, MD [facility] Office  [phone] Pager [phone]  On-Call/Text Page:      [email or web address]      password [email or web address]  If 7PM-7AM, please contact night-coverage [email or web address] Password [facility] 07/26/2012, 9:55 AM   LOS: 2 days

## 2012-07-27 MED ORDER — FUROSEMIDE 10 MG/ML IJ SOLN
40.0000 mg | Freq: Once | INTRAMUSCULAR | Status: AC
  Administered 2012-07-27: 40 mg via INTRAVENOUS
  Filled 2012-07-27: qty 4

## 2012-07-27 MED ORDER — LIDOCAINE HCL 4 % IJ SOLN
5.0000 mL | INTRAMUSCULAR | Status: DC | PRN
  Administered 2012-07-27 – 2012-07-28 (×3): 5 mL
  Filled 2012-07-27 (×4): qty 5

## 2012-07-27 NOTE — Plan of Care (Signed)
Problem: Phase I Progression Outcomes Goal: Progress activity as tolerated unless otherwise ordered Outcome: Completed/Met Date Met:  07/27/12 Patient Ambulated to [facility] approx 261ft

## 2012-07-27 NOTE — Progress Notes (Addendum)
Patient had increased SOB from beginning of shift; respirations ranging from 28-35 but sating 96-97% on 2L [place], albuterol was given with no relief.  RR called.  MD notified and came up to floor to examine patient.  0.5mg  ativan, racenephrine, and lidocaine neb ordered and given.  SOB slowly decreased.  Patient is now resting comfortably with respirations WNL.  [place] [phone] at bedside is an LPN that requests that MD talk to him on am rounds.  Will continue to monitor patient.

## 2012-07-27 NOTE — Progress Notes (Signed)
Pt respiratory protocol,done. BBS clear throughout with wheezing coming from her vocal cord area. Pt is being given lidocaine treatments q4 prn. Tolerating well no further need for the albuterol since it doesn't help her issue. Left PRN dose for times of SOB and WOB with dyspnea from walking to strenuous activities.

## 2012-07-27 NOTE — Progress Notes (Signed)
Event: 2200:  Notified by RN That pt has had increasing RR since beginning of shift. Now w/ audible wheezes. Minimal relief w/ Albuterol neb. Ativan .5mg  ordered to be given IV. NP to bedside. Subjective: Pt unable to give specific information due to tachypnea.  Objective: Grace Barker is a 47 y/o female admitted on 07/24/12 after presenting to ED w/ c/o increasing SOB over last 2 to 3 weeks that had worsened over last couple of days prior to admission. CXR on admission showed RUL PNA. Pt has no h/o chronic respiratory illnesses. She has never been intubated. At bedside pt noted to be very tachypneac. BBS w/ upper airway exp wheezes. Some accessory muscle use.  Lower lung fields CTA. 02 sats 97% on 2L [place]. Current VS, BP-176/96, Recent temp 98.1, P-103, R-26. Pt had minimal relief w/ albuterol neb. Slightly more improved w/ nebulized racemic epinephrine. Pt continues to deny CP or other symptoms. Much improved after nebulized Lidocaine 4% (5ml). F/u w/ RN approx 1 hour after Lidocaine neb and pt was noted to be sleeping comfortably in no distress. Pt placed on telemetry and continuous pulse ox. Telemetry reveals NSR-ST.  Assessment/Plan: 1. Acute respiratory distress w/o hypoxia: Felt to be bronchospasm vs Vocal Cord Instability Syndrome. Resolved w/ Lidocaine neb and small dose of IV Ativan. Pt had similar episode early am. PCXR was w/o acute findings. ABG's have remained normal despite symptoms. Upon reassessment pt noted to be sleeping and no longer tachypneac.Cardiac telemetry reveals HR 79 (NSR). Will continue to monitor closely.  Leanne Chang, NP-C [facility] Pager [phone]

## 2012-07-27 NOTE — Progress Notes (Signed)
[facility] PROGRESS NOTE  Grace Barker XBJ:[id number] DOB: 20-Dec-1965 DOA: 07/24/2012 PCP: No primary provider on file.  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 Non toxic appearing.  Acute respiratory distress - bronchospastic - ?Vocal Cord Instability Syndrome  ?component of vocal cord dysfxn +/- anxiety - low dose anxiolytic - maximal PPI tx for possible reflux assoc vocal cord irritation - no evidence of signif CO2 retention on ABG - pt feels nebs "make it worse" - discussed w/ Pulmonologist -  maximize GERD tx, avoid ACE/ARB, d/c systemic and inhales steroids. CT neg for sinus disease. Episode during night see note. Relief with lidocain nebs and ativan. Currently sats >90 on 2L with mild increased work of breathing. Continue nebs, oxygen, monitoring sats.  would benefit from outpt spirometry once back to baseline.  HTN  Only fair control - SBP ranger135-176. DBP range 77-100. avoiding ACE/ARB due to concern for vocal cord dysfunction - continue BB and CCB Hypokalemia  Due to diuretic - resolved  Anxiety Likely related to #2. Xanax prn  Code Status: full Family Communication: at bedside Disposition Plan: home when ready   Consultants:  none  Procedures:  none  Antibiotics  Levaquin 07/24/2012 >>1/12  Maxipime 07/25/12 >>1/12  Vanc 07/25/12 >>1/12   HPI/Subjective: Lying in bed eyes closed no increased work of breathing. Arouses to verbal stimuli and expiratory wheezes with accessory muscle use. NAD  Objective: Filed Vitals:   07/26/12 2239 07/26/12 2248 07/27/12 0104 07/27/12 0612  BP:  152/100  135/77  Pulse:  99  75  Temp:  98.2 F (36.8 C)  98.2 F (36.8 C)  TempSrc:  Oral  Oral  Resp:  26   22  Height:      Weight:      SpO2: 98% 98% 96% 98%    Intake/Output Summary (Last 24 hours) at 07/27/12 0722 Last data filed at 07/27/12 0400  Gross per 24 hour  Intake    720 ml  Output      0 ml  Net    720 ml   Filed Weights   07/24/12 2014  Weight: 110 kg (242 lb 8.1 oz)    Exam:   General:  Awake alert somewhat anxious  Cardiovascular: RRR No MGR No LEE  Respiratory: normal effort when asleep. Mild increased work of breathing with accessory muscle use when awake. Good air movement. Wheezes particularly upper fields. Bases without crackles  Abdomen: obese, soft +BS non-tender to palpation  Data Reviewed: Basic Metabolic Panel:  Lab 07/26/12 3086 07/25/12 0550 07/24/12 1525  NA 136 138 139  K 4.6 4.1 3.3*  CL 102 102 102  CO2 24 22 25   GLUCOSE 136* 167* 93  BUN 15 13 7   CREATININE 0.74 0.76 0.75  CALCIUM 9.8 9.5 9.2  MG -- -- --  PHOS -- -- --   Liver Function Tests: No results found for this basename: AST:5,ALT:5,ALKPHOS:5,BILITOT:5,PROT:5,ALBUMIN:5 in the last 168 hours No results found for this basename: LIPASE:5,AMYLASE:5 in the last 168 hours No results found for this basename: AMMONIA:5 in the last 168 hours CBC:  Lab 07/25/12 0550 07/24/12 1525  WBC 6.6 4.9  NEUTROABS -- 1.7  HGB 12.7 12.6  HCT 39.0 38.0  MCV 76.9*  76.9*  PLT 225 206   Cardiac Enzymes: No results found for this basename: CKTOTAL:5,CKMB:5,CKMBINDEX:5,TROPONINI:5 in the last 168 hours BNP (last 3 results)  Basename 07/25/12 0600  PROBNP 99.2   CBG: No results found for this basename: GLUCAP:5 in the last 168 hours  Recent Results (from the past 240 hour(s))  MRSA PCR SCREENING     Status: Abnormal   Collection Time   07/25/12  4:45 AM      Component Value Range Status Comment   MRSA by PCR POSITIVE (*) NEGATIVE Final      Studies: Dg Chest Port 1 View  07/26/2012  *RADIOLOGY REPORT*  Clinical Data: Wheezing, shortness of breath  CHEST - 1 VIEW  Comparison:   07/24/2012  Findings: The heart size and mediastinal contours are within normal limits.  Both lungs are clear.  IMPRESSION: No active disease.   Original Report Authenticated By: Judie Petit. Miles Costain, M.D.    Ct Maxillofacial Wo Cm  07/26/2012  *RADIOLOGY REPORT*  Clinical Data: 47 year old female with cough, wheezing, congestion, sinusitis.  CT MAXILLOFACIAL WITHOUT CONTRAST  Technique:  Multidetector CT imaging of the maxillofacial structures was performed. Multiplanar CT image reconstructions were also generated.  Comparison: None.  Findings: Visualized orbits and scalp soft tissues are within normal limits.  Grossly negative visualized noncontrast brain parenchyma.  Adenoid and to a lesser extent tonsillar hypertrophy. Parapharyngeal and retropharyngeal spaces are within normal limits. Other visualized deep soft tissue spaces of the face are within normal limits.  Tympanic cavities and mastoids are clear.  Sphenoid sinuses are clear. Ethmoid air cells are clear. Frontal sinuses are hypoplastic and clear. Maxillary sinuses are clear.  Incidental torus palatinus and asymmetric calcification of the stylohyoid ligaments (greater on the right). No acute osseous abnormality identified.  IMPRESSION: 1.  Negative paranasal sinuses. 2.  Adenoid and tonsillar hypertrophy such as can be seen with URI.   Original Report Authenticated By: Erskine Speed, M.D.     Scheduled Meds:   . albuterol  2.5 mg Nebulization Q6H  . amLODipine  10 mg Oral Daily  . bacitracin   Topical BID  . Chlorhexidine Gluconate Cloth  6 each Topical Q0600  . famotidine  40 mg Oral Daily  . guaiFENesin  1,200 mg Oral BID  . heparin  5,000 Units Subcutaneous Q8H  . labetalol  100 mg Oral BID  . pantoprazole  40 mg Oral BID AC  . sodium chloride  3 mL Intravenous Q12H   Continuous Infusions:   Principal Problem:  *CAP (community acquired pneumonia) Active Problems:  HTN (hypertension)  Respiratory distress  Wheezing    Time spent: 35  minutes    [facility] M  [facility]  If 8PM-8AM, please contact night-coverage at [email or web address], password [facility] 07/27/2012, 7:22 AM  LOS: 3 days

## 2012-07-27 NOTE — Progress Notes (Signed)
Addendum  Patient seen and examined, chart and data base reviewed.  I agree with the above assessment and plan.  For full details please see Mrs. Melbourne Abts NP note.  Reported wheezing, continues to have sats more than 90% on room air.  Concerns about vocal cord dysfunction, lidocaineand Ativan.  Ambulate, wean off of oxygen, she is continued to have stridor to use racemic epinephrine when necessary.   Clint Lipps, MD [facility] Pager: [phone] 07/27/2012, 11:46 AM

## 2012-07-27 NOTE — Plan of Care (Signed)
Problem: Phase I Progression Outcomes Goal: O2 sats > or equal 90% or at baseline Outcome: Progressing Room air

## 2012-07-28 MED ORDER — INFLUENZA VIRUS VACC SPLIT PF IM SUSP: 0.5000 mL | INTRAMUSCULAR | Status: DC

## 2012-07-28 MED ORDER — LABETALOL HCL 100 MG PO TABS: 100.0000 mg | ORAL_TABLET | Freq: Two times a day (BID) | ORAL | Status: DC

## 2012-07-28 MED ORDER — AMLODIPINE BESYLATE 10 MG PO TABS: 10.0000 mg | ORAL_TABLET | Freq: Every day | ORAL | Status: DC

## 2012-07-28 MED ORDER — GUAIFENESIN-DM 100-10 MG/5ML PO SYRP: 5.0000 mL | ORAL_SOLUTION | ORAL | Status: DC | PRN

## 2012-07-28 MED ORDER — PANTOPRAZOLE SODIUM 40 MG PO TBEC: 40.0000 mg | DELAYED_RELEASE_TABLET | Freq: Two times a day (BID) | ORAL | Status: DC

## 2012-07-28 MED ORDER — FAMOTIDINE 40 MG PO TABS: 40.0000 mg | ORAL_TABLET | Freq: Every day | ORAL | Status: DC

## 2012-07-28 MED ORDER — INFLUENZA VIRUS VACC SPLIT PF IM SUSP
0.5000 mL | Freq: Once | INTRAMUSCULAR | Status: AC
  Administered 2012-07-28: 0.5 mL via INTRAMUSCULAR
  Filled 2012-07-28: qty 0.5

## 2012-07-28 MED ORDER — OXYCODONE HCL 5 MG PO TABS: 5.0000 mg | ORAL_TABLET | Freq: Four times a day (QID) | ORAL | Status: DC | PRN

## 2012-07-28 NOTE — Discharge Summary (Signed)
Physician Discharge Summary  Grace Barker ZOX:[id number] DOB: [birth year] DOA: 07/24/2012  PCP: none will refer to [facility] urgent care  Admit date: 07/24/2012 Discharge date: 07/28/2012  Time spent: 40  minutes  Recommendations for Outpatient Follow-up:  1. Follow up with [facility]   Discharge Diagnoses:  Principal Problem:  *CAP (community acquired pneumonia) Active Problems:  HTN (hypertension)  Respiratory distress  Wheezing   Discharge Condition: stable  Diet recommendation: regular  Filed Weights   07/24/12 2014  Weight: 110 kg (242 lb 8.1 oz)    History of present illness:   Grace Barker is a 47 y.o. female with past medical history only significant for hypertension and obesity. She came in to the hospital on 07/24/12 because of shortness of breath. 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 She said she's been having shortness of breath for the previous 2-3 weeks, with followup respiratory symptoms including cough, wheezing and phlegm production. Symptoms come and goe but for the previous several days had been worsening. Patient reached a point that she couldn't breathel so she came in to the emergency department for further medical evaluation. When she was evaluated in the ED the intention was to discharge her home after she got levofloxacin, inhaled albuterol and steroids. But patient continued to have shortness of breath and respiratory distress she couldn't even complete sentences when she is talking. She never had history of COPD or asthma. She never been intubated before. Patient referred to that hospital for admission after the chest x-ray showed right upper lobe pneumonia. Pt admitted to [facility] for treatment    Hospital Course:  Pneumonia - RUL - possibly healthcare acquired (works in [place])   Initially started on levaquin, and then broadened on 07/25/12 with  maxipime and vanc for HCAP. Repeat xray on 07/26/12 showed no infiltrate. Pt afebrile and  no white count even with steroids. Antibiotics discontinues on 07/26/12. Influenza screen negative  Pt remained afebrile with a normal white count during this hospitalization. At discharge she is  Non toxic appearing.  Acute respiratory distress - bronchospastic - ?Vocal Cord Instability Syndrome  ?component of vocal cord dysfxn +/- anxiety - low dose anxiolytic - maximal PPI tx for possible reflux assoc vocal cord irritation - no evidence of signif CO2 retention on ABG - pt feels nebs "make it worse" - discussed w/ Pulmonologist - maximize GERD tx, avoid ACE/ARB, d/c systemic and inhales steroids on 07/26/12. CT neg for sinus disease. Relief with lidocain nebs and ativan.  While sleeping pt exhibited no increased work of breathing or wheezes. On 07/27/12 ambulated 2102ft on room air and maintained sats >90%. At discharge sats >90% on room air. Recommend follow up with PCP for OP work up possible cord dysfunction  HTN  Fair control - SBP ranger135-176. DBP range 77-100. avoiding ACE/ARB due to concern for vocal cord dysfunction - continue BB and CCB  Hypokalemia  Due to diuretic - resolved  Anxiety  Likely related to #2. Xanax prn Resolved at discharge   Procedures:  none  Consultations:  none  Discharge Exam: Filed Vitals:   07/27/12 2005 07/27/12 2141 07/28/12 0319 07/28/12 0605  BP:  128/84  132/83  Pulse:  84  85  Temp:  98.5 F (36.9 C)  98.5 F (36.9 C)  TempSrc:  Oral  Oral  Resp:  24  24  Height:      Weight:      SpO2: 98% 95% 96% 95%    General: awake  alert NAD Cardiovascular: RRR No MGR no LEE Respiratory: normal effort BS somewhat coarse bases. Good air flow. Wheeze upper airway  Discharge Instructions  Discharge Orders    Future Orders Please Complete By Expires   Diet - low sodium heart healthy      Increase activity slowly      Call MD for:  persistant nausea and vomiting      Call MD for:  persistant dizziness or light-headedness          Medication List       As of 07/28/2012  9:55 AM    STOP taking these medications         LISINOPRIL-HYDROCHLOROTHIAZIDE PO      TAKE these medications         amLODipine 10 MG tablet   Commonly known as: NORVASC   Take 1 tablet (10 mg total) by mouth daily.      famotidine 40 MG tablet   Commonly known as: PEPCID   Take 1 tablet (40 mg total) by mouth daily.      guaiFENesin-dextromethorphan 100-10 MG/5ML syrup   Commonly known as: ROBITUSSIN DM   Take 5 mLs by mouth every 4 (four) hours as needed for cough.      labetalol 100 MG tablet   Commonly known as: NORMODYNE   Take 1 tablet (100 mg total) by mouth 2 (two) times daily.      oxyCODONE 5 MG immediate release tablet   Commonly known as: Oxy IR/ROXICODONE   Take 1 tablet (5 mg total) by mouth every 6 (six) hours as needed.      pantoprazole 40 MG tablet   Commonly known as: PROTONIX   Take 1 tablet (40 mg total) by mouth 2 (two) times daily before a meal.      ROBITUSSIN ALLERGY/COUGH PO   Take 30 mLs by mouth every 4 (four) hours as needed. For cough/cold           Follow-up Information    Follow up with [facility]. Schedule an appointment as soon as possible for a visit in 1 week.   Contact information:   [address] [place] [address]           The results of significant diagnostics from this hospitalization (including imaging, microbiology, ancillary and laboratory) are listed below for reference.    Significant Diagnostic Studies: Dg Chest 2 View  07/24/2012  *RADIOLOGY REPORT*  Clinical Data: Wheezing.  Shortness of breath.  CHEST - 2 VIEW  Comparison: None.  Findings: Indistinct airspace opacity observed in the right upper lobe.  There is mild tortuosity of the thoracic aorta.  Heart size within normal limits.  Thoracic spondylosis is present.  No pleural effusion identified.  IMPRESSION:  1.  Indistinct airspace opacity in the right upper lobe, favoring early pneumonia.  Follow-up chest  radiography in 4-6 weeks time is recommended to ensure clearance and exclude the unlikely possibility of malignancy. 2.  Thoracic spondylosis.   Original Report Authenticated By: Gaylyn Rong, M.D.    Dg Chest Port 1 View  07/26/2012  *RADIOLOGY REPORT*  Clinical Data: Wheezing, shortness of breath  CHEST - 1 VIEW  Comparison:  07/24/2012  Findings: The heart size and mediastinal contours are within normal limits.  Both lungs are clear.  IMPRESSION: No active disease.   Original Report Authenticated By: Judie Petit. Miles Costain, M.D.    Ct Maxillofacial Wo Cm  07/26/2012  *RADIOLOGY REPORT*  Clinical Data: 47 year old  female with cough, wheezing, congestion, sinusitis.  CT MAXILLOFACIAL WITHOUT CONTRAST  Technique:  Multidetector CT imaging of the maxillofacial structures was performed. Multiplanar CT image reconstructions were also generated.  Comparison: None.  Findings: Visualized orbits and scalp soft tissues are within normal limits.  Grossly negative visualized noncontrast brain parenchyma.  Adenoid and to a lesser extent tonsillar hypertrophy. Parapharyngeal and retropharyngeal spaces are within normal limits. Other visualized deep soft tissue spaces of the face are within normal limits.  Tympanic cavities and mastoids are clear.  Sphenoid sinuses are clear. Ethmoid air cells are clear. Frontal sinuses are hypoplastic and clear. Maxillary sinuses are clear.  Incidental torus palatinus and asymmetric calcification of the stylohyoid ligaments (greater on the right). No acute osseous abnormality identified.  IMPRESSION: 1.  Negative paranasal sinuses. 2.  Adenoid and tonsillar hypertrophy such as can be seen with URI.   Original Report Authenticated By: Erskine Speed, M.D.     Microbiology: Recent Results (from the past 240 hour(s))  MRSA PCR SCREENING     Status: Abnormal   Collection Time   07/25/12  4:45 AM      Component Value Range Status Comment   MRSA by PCR POSITIVE (*) NEGATIVE Final       Labs: Basic Metabolic Panel:  Lab 07/26/12 1191 07/25/12 0550 07/24/12 1525  NA 136 138 139  K 4.6 4.1 3.3*  CL 102 102 102  CO2 24 22 25   GLUCOSE 136* 167* 93  BUN 15 13 7   CREATININE 0.74 0.76 0.75  CALCIUM 9.8 9.5 9.2  MG -- -- --  PHOS -- -- --   Liver Function Tests: No results found for this basename: AST:5,ALT:5,ALKPHOS:5,BILITOT:5,PROT:5,ALBUMIN:5 in the last 168 hours No results found for this basename: LIPASE:5,AMYLASE:5 in the last 168 hours No results found for this basename: AMMONIA:5 in the last 168 hours CBC:  Lab 07/25/12 0550 07/24/12 1525  WBC 6.6 4.9  NEUTROABS -- 1.7  HGB 12.7 12.6  HCT 39.0 38.0  MCV 76.9* 76.9*  PLT 225 206   Cardiac Enzymes: No results found for this basename: CKTOTAL:5,CKMB:5,CKMBINDEX:5,TROPONINI:5 in the last 168 hours BNP: BNP (last 3 results)  Basename 07/25/12 0600  PROBNP 99.2   CBG: No results found for this basename: GLUCAP:5 in the last 168 hours     Signed:  Gwenyth Bender  [facility] 07/28/2012, 9:55 AM

## 2012-07-28 NOTE — Discharge Summary (Signed)
Addendum  Patient seen and examined, chart and data base reviewed.  I agree with the above assessment and plan.  For full details please see Mrs. Toya Smothers NP note.  Admitted for respiratory distress, questionable pneumonia.  I think she has pseudo-wheezing, the wheezing she has; is upper respiratory tract transferred sounds.  Oxygen saturation is > 95% on room air, there was concern about his VCD.  Consider ENT referral if patient continues to have symptoms, I think her wheezing is functional.   Clint Lipps, MD [facility] Pager: [phone] 07/28/2012, 2:41 PM

## 2012-07-28 NOTE — Progress Notes (Signed)
Galadriel Stgermaine to be D/C'd Home per MD order.  Discharge instructions reviewed and discussed with the patient, all questions and concerns answered. Copy of instructions and scripts given to patient.   Shave, Damonica  Home Medication Instructions [id number]   Printed on:07/28/12 1147  Medication Information                    Pseudoeph-Bromphen-DM (ROBITUSSIN ALLERGY/COUGH PO) Take 30 mLs by mouth every 4 (four) hours as needed. For cough/cold           amLODipine (NORVASC) 10 MG tablet Take 1 tablet (10 mg total) by mouth daily.           famotidine (PEPCID) 40 MG tablet Take 1 tablet (40 mg total) by mouth daily.           guaiFENesin-dextromethorphan (ROBITUSSIN DM) 100-10 MG/5ML syrup Take 5 mLs by mouth every 4 (four) hours as needed for cough.           labetalol (NORMODYNE) 100 MG tablet Take 1 tablet (100 mg total) by mouth 2 (two) times daily.           oxyCODONE (OXY IR/ROXICODONE) 5 MG immediate release tablet Take 1 tablet (5 mg total) by mouth every 6 (six) hours as needed.           pantoprazole (PROTONIX) 40 MG tablet Take 1 tablet (40 mg total) by mouth 2 (two) times daily before a meal.             Patients skin is clean, dry and intact no evidence of skin break down. IV site discontinued and catheter remains intact. Site without signs and symptoms of complications. Dressing and pressure applied.  Patient escorted to car by volunteers in a wheelchair,  no distress noted upon discharge.  Bing Quarry 07/28/2012 11:47 AM

## 2012-07-29 NOTE — Care Management Note (Signed)
    Page 1 of 1   07/29/2012     5:41:15 PM   CARE MANAGEMENT NOTE 07/29/2012  Patient:  Grace Barker, Grace Barker   Account Number:  [id number]  Date Initiated:  07/29/2012  Documentation initiated by:  Letha Cape  Subjective/Objective Assessment:   dx pna  admit     Action/Plan:   Anticipated DC Date:  07/28/2012   Anticipated DC Plan:  HOME/SELF CARE      DC Planning Services  CM consult      Choice offered to / List presented to:             Status of service:  Completed, signed off Medicare Important Message given?   (If response is "NO", the following Medicare IM given date fields will be blank) Date Medicare IM given:   Date Additional Medicare IM given:    Discharge Disposition:  HOME/SELF CARE  Per UR Regulation:  Reviewed for med. necessity/level of care/duration of stay  If discussed at Long Length of Stay Meetings, dates discussed:    Comments:

## 2013-09-02 ENCOUNTER — Telehealth: Payer: Self-pay | Admitting: *Deleted

## 2013-09-02 DIAGNOSIS — R002 Palpitations: Secondary | ICD-10-CM

## 2013-09-02 NOTE — Telephone Encounter (Signed)
Dr. Kateri PlummerMorrow from [facility] has patient with palpitations needing 24 hour Holter Monitor - per Dr. Graciela HusbandsKlein.

## 2014-01-18 ENCOUNTER — Emergency Department (HOSPITAL_COMMUNITY): Payer: No Typology Code available for payment source

## 2014-01-18 ENCOUNTER — Emergency Department (HOSPITAL_COMMUNITY)
Admission: EM | Admit: 2014-01-18 | Discharge: 2014-01-18 | Disposition: A | Discharge status: Home / Self Care | Payer: No Typology Code available for payment source | Attending: Emergency Medicine | Admitting: Emergency Medicine

## 2014-01-18 ENCOUNTER — Encounter (HOSPITAL_COMMUNITY): Payer: Self-pay | Admitting: Emergency Medicine

## 2014-01-18 DIAGNOSIS — Y92410 Unspecified street and highway as the place of occurrence of the external cause: Secondary | ICD-10-CM | POA: Insufficient documentation

## 2014-01-18 DIAGNOSIS — Z79899 Other long term (current) drug therapy: Secondary | ICD-10-CM | POA: Insufficient documentation

## 2014-01-18 DIAGNOSIS — Y9389 Activity, other specified: Secondary | ICD-10-CM | POA: Insufficient documentation

## 2014-01-18 DIAGNOSIS — S63509A Unspecified sprain of unspecified wrist, initial encounter: Secondary | ICD-10-CM | POA: Insufficient documentation

## 2014-01-18 DIAGNOSIS — I10 Essential (primary) hypertension: Secondary | ICD-10-CM | POA: Insufficient documentation

## 2014-01-18 DIAGNOSIS — S63501A Unspecified sprain of right wrist, initial encounter: Secondary | ICD-10-CM

## 2014-01-18 MED ORDER — TRAMADOL HCL 50 MG PO TABS: 50.0000 mg | ORAL_TABLET | Freq: Four times a day (QID) | ORAL | Status: DC | PRN
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
## 2014-01-18 NOTE — Discharge Instructions (Signed)
For pain control you may take:  800mg  of ibuprofen (that is usually 4 over the counter pills)  3 times a day (take with food) and acetaminophen 975mg  (this is 3 over the counter pills) four times a day. Do not drink alcohol or combine with other medications that have acetaminophen as an ingredient (Read the labels!).  For breakthrough pain you may take Tramadol. Do not drink alcohol drive or operate heavy machinery when taking Tramadol.  Do not hesitate to return to the Emergency Department for any new, worsening or concerning symptoms.   If you do not have a primary care doctor you can establish one at the   [facility]: [address] [place] [address] [phone]  After you establish care. Let them know you were seen in the emergency room. They must obtain records for further management.    Wrist Sprain with Rehab A sprain is an injury in which a ligament that maintains the proper alignment of a joint is partially or completely torn. The ligaments of the wrist are susceptible to sprains. Sprains are classified into three categories. Grade 1 sprains cause pain, but the tendon is not lengthened. Grade 2 sprains include a lengthened ligament because the ligament is stretched or partially ruptured. With grade 2 sprains there is still function, although the function may be diminished. Grade 3 sprains are characterized by a complete tear of the tendon or muscle, and function is usually impaired. SYMPTOMS   Pain tenderness, inflammation, and/or bruising (contusion) of the injury.  A "pop" or tear felt and/or heard at the time of injury.  Decreased wrist function. CAUSES  A wrist sprain occurs when a force is placed on one or more ligaments that is greater than it/they can withstand. Common mechanisms of injury include:  Catching a ball with you hands.  Repetitive and/ or strenuous extension or flexion of the wrist. RISK INCREASES WITH:  Previous wrist  injury.  Contact sports (boxing or wrestling).  Activities in which falling is common.  Poor strength and flexibility.  Improperly fitted or padded protective equipment. PREVENTION  Warm up and stretch properly before activity.  Allow for adequate recovery between workouts.  Maintain physical fitness:  Strength, flexibility, and endurance.  Cardiovascular fitness.  Protect the wrist joint by limiting its motion with the use of taping, braces, or splints.  Protect the wrist after injury for 6 to 12 months. PROGNOSIS  The prognosis for wrist sprains depends on the degree of injury. Grade 1 sprains require 2 to 6 weeks of treatment. Grade 2 sprains require 6 to 8 weeks of treatment, and grade 3 sprains require up to 12 weeks.  RELATED COMPLICATIONS   Prolonged healing time, if improperly treated or re-injured.  Recurrent symptoms that result in a chronic problem.  Injury to nearby structures (bone, cartilage, nerves, or tendons).  Arthritis of the wrist.  Inability to compete in athletics at a high level.  Wrist stiffness or weakness.  Progression to a complete rupture of the ligament. TREATMENT  Treatment initially involves resting from any activities that aggravate the symptoms, and the use of ice and medications to help reduce pain and inflammation. Your caregiver may recommend immobilizing the wrist for a period of time in order to reduce stress on the ligament and allow for healing. After immobilization it is important to perform strengthening and stretching exercises to help regain strength and a full range of motion. These exercises may be completed at home or with a therapist. Surgery  is not usually required for wrist sprains, unless the ligament has been ruptured (grade 3 sprain). MEDICATION   If pain medication is necessary, then nonsteroidal anti-inflammatory medications, such as aspirin and ibuprofen, or other minor pain relievers, such as acetaminophen, are  often recommended.  Do not take pain medication for 7 days before surgery.  Prescription pain relievers may be given if deemed necessary by your caregiver. Use only as directed and only as much as you need. HEAT AND COLD  Cold treatment (icing) relieves pain and reduces inflammation. Cold treatment should be applied for 10 to 15 minutes every 2 to 3 hours for inflammation and pain and immediately after any activity that aggravates your symptoms. Use ice packs or massage the area with a piece of ice (ice massage).  Heat treatment may be used prior to performing the stretching and strengthening activities prescribed by your caregiver, physical therapist, or athletic trainer. Use a heat pack or soak your injury in warm water. SEEK MEDICAL CARE IF:  Treatment seems to offer no benefit, or the condition worsens.  Any medications produce adverse side effects. EXERCISES RANGE OF MOTION (ROM) AND STRETCHING EXERCISES - Wrist Sprain  These exercises may help you when beginning to rehabilitate your injury. Your symptoms may resolve with or without further involvement from your physician, physical therapist or athletic trainer. While completing these exercises, remember:   Restoring tissue flexibility helps normal motion to return to the joints. This allows healthier, less painful movement and activity.  An effective stretch should be held for at least 30 seconds.  A stretch should never be painful. You should only feel a gentle lengthening or release in the stretched tissue. RANGE OF MOTION - Wrist Flexion, Active-Assisted  Extend your right / left elbow with your fingers pointing down.*  Gently pull the back of your hand towards you until you feel a gentle stretch on the top of your forearm.  Hold this position for __________ seconds. Repeat __________ times. Complete this exercise __________ times per day.  *If directed by your physician, physical therapist or athletic trainer, complete this  stretch with your elbow bent rather than extended. RANGE OF MOTION - Wrist Extension, Active-Assisted  Extend your right / left elbow and turn your palm upwards.*  Gently pull your palm/fingertips back so your wrist extends and your fingers point more toward the ground.  You should feel a gentle stretch on the inside of your forearm.  Hold this position for __________ seconds. Repeat __________ times. Complete this exercise __________ times per day. *If directed by your physician, physical therapist or athletic trainer, complete this stretch with your elbow bent, rather than extended. RANGE OF MOTION - Supination, Active  Stand or sit with your elbows at your side. Bend your right / left elbow to 90 degrees.  Turn your palm upward until you feel a gentle stretch on the inside of your forearm.  Hold this position for __________ seconds. Slowly release and return to the starting position. Repeat __________ times. Complete this stretch __________ times per day.  RANGE OF MOTION - Pronation, Active  Stand or sit with your elbows at your side. Bend your right / left elbow to 90 degrees.  Turn your palm downward until you feel a gentle stretch on the top of your forearm.  Hold this position for __________ seconds. Slowly release and return to the starting position. Repeat __________ times. Complete this stretch __________ times per day.  STRETCH - Wrist Flexion  Place the back of  your right / left hand on a tabletop leaving your elbow slightly bent. Your fingers should point away from your body.  Gently press the back of your hand down onto the table by straightening your elbow. You should feel a stretch on the top of your forearm.  Hold this position for __________ seconds. Repeat __________ times. Complete this stretch __________ times per day.  STRETCH - Wrist Extension  Place your right / left fingertips on a tabletop leaving your elbow slightly bent. Your fingers should point  backwards.  Gently press your fingers and palm down onto the table by straightening your elbow. You should feel a stretch on the inside of your forearm.  Hold this position for __________ seconds. Repeat __________ times. Complete this stretch __________ times per day.  STRENGTHENING EXERCISES - Wrist Sprain These exercises may help you when beginning to rehabilitate your injury. They may resolve your symptoms with or without further involvement from your physician, physical therapist or athletic trainer. While completing these exercises, remember:   Muscles can gain both the endurance and the strength needed for everyday activities through controlled exercises.  Complete these exercises as instructed by your physician, physical therapist or athletic trainer. Progress with the resistance and repetition exercises only as your caregiver advises. STRENGTH - Wrist Flexors  Sit with your right / left forearm palm-up and fully supported. Your elbow should be resting below the height of your shoulder. Allow your wrist to extend over the edge of the surface.  Loosely holding a __________ weight or a piece of rubber exercise band/tubing, slowly curl your hand up toward your forearm.  Hold this position for __________ seconds. Slowly lower the wrist back to the starting position in a controlled manner. Repeat __________ times. Complete this exercise __________ times per day.  STRENGTH - Wrist Extensors  Sit with your right / left forearm palm-down and fully supported. Your elbow should be resting below the height of your shoulder. Allow your wrist to extend over the edge of the surface.  Loosely holding a __________ weight or a piece of rubber exercise band/tubing, slowly curl your hand up toward your forearm.  Hold this position for __________ seconds. Slowly lower the wrist back to the starting position in a controlled manner. Repeat __________ times. Complete this exercise __________ times per  day.  STRENGTH - Ulnar Deviators  Stand with a ____________________ weight in your right / left hand, or sit holding on to the rubber exercise band/tubing with your opposite arm supported.  Move your wrist so that your pinkie travels toward your forearm and your thumb moves away from your forearm.  Hold this position for __________ seconds and then slowly lower the wrist back to the starting position. Repeat __________ times. Complete this exercise __________ times per day STRENGTH - Radial Deviators  Stand with a ____________________ weight in your  right / left hand, or sit holding on to the rubber exercise band/tubing with your arm supported.  Raise your hand upward in front of you or pull up on the rubber tubing.  Hold this position for __________ seconds and then slowly lower the wrist back to the starting position. Repeat __________ times. Complete this exercise __________ times per day. STRENGTH - Forearm Supinators  Sit with your right / left forearm supported on a table, keeping your elbow below shoulder height. Rest your hand over the edge, palm down.  Gently grip a hammer or a soup ladle.  Without moving your elbow, slowly turn your palm and  hand upward to a "thumbs-up" position.  Hold this position for __________ seconds. Slowly return to the starting position. Repeat __________ times. Complete this exercise __________ times per day.  STRENGTH - Forearm Pronators  Sit with your right / left forearm supported on a table, keeping your elbow below shoulder height. Rest your hand over the edge, palm up.  Gently grip a hammer or a soup ladle.  Without moving your elbow, slowly turn your palm and hand upward to a "thumbs-up" position.  Hold this position for __________ seconds. Slowly return to the starting position. Repeat __________ times. Complete this exercise __________ times per day.  STRENGTH - Grip  Grasp a tennis ball, a dense sponge, or a large, rolled sock in  your hand.  Squeeze as hard as you can without increasing any pain.  Hold this position for __________ seconds. Release your grip slowly. Repeat __________ times. Complete this exercise __________ times per day.  Document Released: 07/01/2005 Document Revised: 09/23/2011 Document Reviewed: 10/13/2008 [facility] Patient Information 2015 [place], [place]. This information is not intended to replace advice given to you by your health care provider. Make sure you discuss any questions you have with your health care provider.
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
## 2014-01-19 NOTE — ED Provider Notes (Signed)
Medical screening examination/treatment/procedure(s) were performed by non-physician practitioner and as supervising physician I was immediately available for consultation/collaboration.   EKG Interpretation None       Anais Koenen, MD 01/19/14 0207 

## 2015-01-21 ENCOUNTER — Encounter (HOSPITAL_COMMUNITY): Payer: Self-pay

## 2015-01-21 ENCOUNTER — Inpatient Hospital Stay (HOSPITAL_COMMUNITY)
Admission: EM | Admit: 2015-01-21 | Discharge: 2015-01-23 | DRG: 189 | Disposition: A | Discharge status: Home / Self Care | Payer: BLUE CROSS/BLUE SHIELD | Attending: Pulmonary Disease | Admitting: Pulmonary Disease

## 2015-01-21 ENCOUNTER — Emergency Department (HOSPITAL_COMMUNITY): Payer: BLUE CROSS/BLUE SHIELD

## 2015-01-21 DIAGNOSIS — D509 Iron deficiency anemia, unspecified: Secondary | ICD-10-CM | POA: Diagnosis present

## 2015-01-21 DIAGNOSIS — R0600 Dyspnea, unspecified: Secondary | ICD-10-CM

## 2015-01-21 DIAGNOSIS — Z6841 Body Mass Index (BMI) 40.0 and over, adult: Secondary | ICD-10-CM

## 2015-01-21 DIAGNOSIS — J383 Other diseases of vocal cords: Secondary | ICD-10-CM | POA: Diagnosis present

## 2015-01-21 DIAGNOSIS — K449 Diaphragmatic hernia without obstruction or gangrene: Secondary | ICD-10-CM | POA: Diagnosis present

## 2015-01-21 DIAGNOSIS — I10 Essential (primary) hypertension: Secondary | ICD-10-CM | POA: Diagnosis present

## 2015-01-21 DIAGNOSIS — F419 Anxiety disorder, unspecified: Secondary | ICD-10-CM | POA: Diagnosis present

## 2015-01-21 DIAGNOSIS — K219 Gastro-esophageal reflux disease without esophagitis: Secondary | ICD-10-CM | POA: Diagnosis present

## 2015-01-21 DIAGNOSIS — R0603 Acute respiratory distress: Secondary | ICD-10-CM | POA: Insufficient documentation

## 2015-01-21 DIAGNOSIS — E876 Hypokalemia: Secondary | ICD-10-CM | POA: Diagnosis present

## 2015-01-21 DIAGNOSIS — J9601 Acute respiratory failure with hypoxia: Principal | ICD-10-CM | POA: Diagnosis present

## 2015-01-21 DIAGNOSIS — J4552 Severe persistent asthma with status asthmaticus: Secondary | ICD-10-CM

## 2015-01-21 DIAGNOSIS — E669 Obesity, unspecified: Secondary | ICD-10-CM | POA: Diagnosis present

## 2015-01-21 DIAGNOSIS — J9600 Acute respiratory failure, unspecified whether with hypoxia or hypercapnia: Secondary | ICD-10-CM | POA: Diagnosis present

## 2015-01-21 DIAGNOSIS — Z90710 Acquired absence of both cervix and uterus: Secondary | ICD-10-CM

## 2015-01-21 DIAGNOSIS — Z880 Allergy status to penicillin: Secondary | ICD-10-CM

## 2015-01-21 DIAGNOSIS — J45901 Unspecified asthma with (acute) exacerbation: Secondary | ICD-10-CM | POA: Diagnosis present

## 2015-01-21 DIAGNOSIS — R1310 Dysphagia, unspecified: Secondary | ICD-10-CM

## 2015-01-21 LAB — CBC WITH DIFFERENTIAL/PLATELET
BASOS PCT: 0 % (ref 0–1)
Basophils Absolute: 0 10*3/uL (ref 0.0–0.1)
EOS ABS: 0.3 10*3/uL (ref 0.0–0.7)
Eosinophils Relative: 5 % (ref 0–5)
HCT: 36 % (ref 36.0–46.0)
HEMOGLOBIN: 12 g/dL (ref 12.0–15.0)
LYMPHS ABS: 2.7 10*3/uL (ref 0.7–4.0)
Lymphocytes Relative: 44 % (ref 12–46)
MCH: 25.5 pg — ABNORMAL LOW (ref 26.0–34.0)
MCHC: 33.3 g/dL (ref 30.0–36.0)
MCV: 76.4 fL — ABNORMAL LOW (ref 78.0–100.0)
MONO ABS: 0.5 10*3/uL (ref 0.1–1.0)
Monocytes Relative: 8 % (ref 3–12)
NEUTROS PCT: 43 % (ref 43–77)
Neutro Abs: 2.6 10*3/uL (ref 1.7–7.7)
Platelets: 242 10*3/uL (ref 150–400)
RBC: 4.71 MIL/uL (ref 3.87–5.11)
RDW: 15.4 % (ref 11.5–15.5)
WBC: 6 10*3/uL (ref 4.0–10.5)

## 2015-01-21 LAB — BASIC METABOLIC PANEL
ANION GAP: 8 (ref 5–15)
Anion gap: 12 (ref 5–15)
BUN: 9 mg/dL (ref 6–20)
BUN: 7 mg/dL (ref 6–20)
CHLORIDE: 105 mmol/L (ref 101–111)
CO2: 22 mmol/L (ref 22–32)
CO2: 21 mmol/L — ABNORMAL LOW (ref 22–32)
Calcium: 9.7 mg/dL (ref 8.9–10.3)
Calcium: 9.4 mg/dL (ref 8.9–10.3)
Chloride: 110 mmol/L (ref 101–111)
Creatinine, Ser: 1.02 mg/dL — ABNORMAL HIGH (ref 0.44–1.00)
Creatinine, Ser: 0.75 mg/dL (ref 0.44–1.00)
GFR calc Af Amer: >60 mL/min (ref >60)
GFR calc non Af Amer: >60 mL/min (ref >60)
GFR calc non Af Amer: >60 mL/min (ref >60)
Glucose, Bld: 139 mg/dL — ABNORMAL HIGH (ref 65–99)
Glucose, Bld: 135 mg/dL — ABNORMAL HIGH (ref 65–99)
POTASSIUM: 4.1 mmol/L (ref 3.5–5.1)
Potassium: 2.6 mmol/L — CL (ref 3.5–5.1)
SODIUM: 139 mmol/L (ref 135–145)
Sodium: 139 mmol/L (ref 135–145)

## 2015-01-21 LAB — URINALYSIS, ROUTINE W REFLEX MICROSCOPIC
Bilirubin Urine: NEGATIVE
Glucose, UA: NEGATIVE mg/dL
Hgb urine dipstick: NEGATIVE
KETONES UR: NEGATIVE mg/dL
LEUKOCYTES UA: NEGATIVE
NITRITE: NEGATIVE
PH: 6 (ref 5.0–8.0)
PROTEIN: NEGATIVE mg/dL
Specific Gravity, Urine: 1.016 (ref 1.005–1.030)
Urobilinogen, UA: 0.2 mg/dL (ref 0.0–1.0)

## 2015-01-21 LAB — I-STAT ARTERIAL BLOOD GAS, ED
Acid-base deficit: 2 mmol/L (ref 0.0–2.0)
Bicarbonate: 20.4 mEq/L (ref 20.0–24.0)
O2 SAT: 99 %
PCO2 ART: 26.6 mmHg — AB (ref 35.0–45.0)
PO2 ART: 115 mmHg — AB (ref 80.0–100.0)
Patient temperature: 98.7
TCO2: 21 mmol/L (ref 0–100)
pH, Arterial: 7.493 — ABNORMAL HIGH (ref 7.350–7.450)

## 2015-01-21 LAB — GLUCOSE, CAPILLARY
Glucose-Capillary: 152 mg/dL — ABNORMAL HIGH (ref 65–99)
Glucose-Capillary: 162 mg/dL — ABNORMAL HIGH (ref 65–99)
Glucose-Capillary: 134 mg/dL — ABNORMAL HIGH (ref 65–99)
Glucose-Capillary: 118 mg/dL — ABNORMAL HIGH (ref 65–99)

## 2015-01-21 LAB — TROPONIN I: Troponin I: <0.03 ng/mL (ref <0.031)

## 2015-01-21 LAB — PROCALCITONIN

## 2015-01-21 LAB — RAPID URINE DRUG SCREEN, HOSP PERFORMED
Amphetamines: NOT DETECTED
BARBITURATES: NOT DETECTED
Benzodiazepines: POSITIVE — AB
COCAINE: NOT DETECTED
Opiates: NOT DETECTED
TETRAHYDROCANNABINOL: NOT DETECTED

## 2015-01-21 LAB — BRAIN NATRIURETIC PEPTIDE: B NATRIURETIC PEPTIDE 5: 14.9 pg/mL (ref 0.0–100.0)

## 2015-01-21 LAB — I-STAT CG4 LACTIC ACID, ED: LACTIC ACID, VENOUS: 4.29 mmol/L — AB (ref 0.5–2.0)

## 2015-01-21 LAB — LACTIC ACID, PLASMA: Lactic Acid, Venous: 5.8 mmol/L (ref 0.5–2.0)

## 2015-01-21 LAB — MRSA PCR SCREENING: MRSA BY PCR: POSITIVE — AB

## 2015-01-21 MED ORDER — BUDESONIDE 0.5 MG/2ML IN SUSP
0.5000 mg | Freq: Two times a day (BID) | RESPIRATORY_TRACT | Status: DC
  Administered 2015-01-21 (×2): 0.5 mg via RESPIRATORY_TRACT
  Filled 2015-01-21 (×4): qty 2

## 2015-01-21 MED ORDER — SODIUM CHLORIDE 0.9 % IV SOLN
80.0000 mg | Freq: Two times a day (BID) | INTRAVENOUS | Status: DC
  Filled 2015-01-21 (×2): qty 80

## 2015-01-21 MED ORDER — HYDRALAZINE HCL 20 MG/ML IJ SOLN: 10.0000 mg | INTRAMUSCULAR | Status: DC | PRN

## 2015-01-21 MED ORDER — ALPRAZOLAM 0.25 MG PO TABS
0.2500 mg | ORAL_TABLET | Freq: Three times a day (TID) | ORAL | Status: DC | PRN
  Administered 2015-01-21 – 2015-01-23 (×2): 0.25 mg via ORAL
  Filled 2015-01-21 (×3): qty 1

## 2015-01-21 MED ORDER — ALBUTEROL (5 MG/ML) CONTINUOUS INHALATION SOLN
10.0000 mg/h | INHALATION_SOLUTION | Freq: Once | RESPIRATORY_TRACT | Status: AC
  Administered 2015-01-21: 10 mg/h via RESPIRATORY_TRACT
  Filled 2015-01-21: qty 20

## 2015-01-21 MED ORDER — POTASSIUM CHLORIDE 10 MEQ/100ML IV SOLN
10.0000 meq | Freq: Once | INTRAVENOUS | Status: AC
  Administered 2015-01-21: 10 meq via INTRAVENOUS
  Filled 2015-01-21: qty 100

## 2015-01-21 MED ORDER — SODIUM CHLORIDE 0.9 % IV SOLN
INTRAVENOUS | Status: DC
  Administered 2015-01-21: 09:00:00 via INTRAVENOUS

## 2015-01-21 MED ORDER — MUPIROCIN 2 % EX OINT
1.0000 "application " | TOPICAL_OINTMENT | Freq: Two times a day (BID) | CUTANEOUS | Status: DC
  Administered 2015-01-21 – 2015-01-23 (×4): 1 via NASAL
  Filled 2015-01-21: qty 22

## 2015-01-21 MED ORDER — DIPHENHYDRAMINE HCL 50 MG/ML IJ SOLN
12.5000 mg | Freq: Three times a day (TID) | INTRAMUSCULAR | Status: DC
  Administered 2015-01-21: 12.5 mg via INTRAVENOUS
  Filled 2015-01-21: qty 1

## 2015-01-21 MED ORDER — LORAZEPAM 2 MG/ML IJ SOLN
1.0000 mg | Freq: Once | INTRAMUSCULAR | Status: AC
  Administered 2015-01-21: 1 mg via INTRAVENOUS
  Filled 2015-01-21: qty 1

## 2015-01-21 MED ORDER — HYDRALAZINE HCL 20 MG/ML IJ SOLN
10.0000 mg | Freq: Once | INTRAMUSCULAR | Status: AC
  Administered 2015-01-21: 10 mg via INTRAVENOUS
  Filled 2015-01-21: qty 1

## 2015-01-21 MED ORDER — ENOXAPARIN SODIUM 40 MG/0.4ML ~~LOC~~ SOLN
40.0000 mg | SUBCUTANEOUS | Status: DC
  Administered 2015-01-21 – 2015-01-23 (×3): 40 mg via SUBCUTANEOUS
  Filled 2015-01-21 (×3): qty 0.4

## 2015-01-21 MED ORDER — ARFORMOTEROL TARTRATE 15 MCG/2ML IN NEBU
15.0000 ug | INHALATION_SOLUTION | Freq: Two times a day (BID) | RESPIRATORY_TRACT | Status: DC
  Administered 2015-01-21 (×2): 15 ug via RESPIRATORY_TRACT
  Filled 2015-01-21 (×4): qty 2

## 2015-01-21 MED ORDER — INSULIN ASPART 100 UNIT/ML ~~LOC~~ SOLN
0.0000 [IU] | SUBCUTANEOUS | Status: DC
  Administered 2015-01-21 (×2): 2 [IU] via SUBCUTANEOUS
  Administered 2015-01-21: 3 [IU] via SUBCUTANEOUS
  Administered 2015-01-22 (×2): 2 [IU] via SUBCUTANEOUS

## 2015-01-21 MED ORDER — MAGNESIUM SULFATE 2 GM/50ML IV SOLN
2.0000 g | Freq: Once | INTRAVENOUS | Status: AC
  Administered 2015-01-21: 2 g via INTRAVENOUS
  Filled 2015-01-21: qty 50

## 2015-01-21 MED ORDER — ALBUTEROL SULFATE (2.5 MG/3ML) 0.083% IN NEBU
2.5000 mg | INHALATION_SOLUTION | RESPIRATORY_TRACT | Status: DC | PRN
Start: 2015-01-21 — End: 2015-01-23

## 2015-01-21 MED ORDER — POTASSIUM CHLORIDE 10 MEQ/100ML IV SOLN
10.0000 meq | INTRAVENOUS | Status: AC
  Administered 2015-01-21 (×6): 10 meq via INTRAVENOUS
  Filled 2015-01-21 (×4): qty 100

## 2015-01-21 MED ORDER — ALBUTEROL SULFATE (2.5 MG/3ML) 0.083% IN NEBU: 2.5000 mg | INHALATION_SOLUTION | RESPIRATORY_TRACT | Status: DC | PRN

## 2015-01-21 MED ORDER — MIDAZOLAM HCL 2 MG/2ML IJ SOLN
2.0000 mg | Freq: Once | INTRAMUSCULAR | Status: AC
  Administered 2015-01-21: 2 mg via INTRAVENOUS
  Filled 2015-01-21: qty 2

## 2015-01-21 MED ORDER — HEPARIN SODIUM (PORCINE) 5000 UNIT/ML IJ SOLN
5000.0000 [IU] | Freq: Three times a day (TID) | INTRAMUSCULAR | Status: DC
Start: 2015-01-21 — End: 2015-01-21
  Administered 2015-01-21: 5000 [IU] via SUBCUTANEOUS

## 2015-01-21 MED ORDER — ALBUTEROL (5 MG/ML) CONTINUOUS INHALATION SOLN: 10.0000 mg/h | INHALATION_SOLUTION | Freq: Once | RESPIRATORY_TRACT | Status: DC

## 2015-01-21 MED ORDER — SODIUM CHLORIDE 0.45 % IV SOLN
INTRAVENOUS | Status: DC
  Administered 2015-01-21: 11:00:00 via INTRAVENOUS
  Filled 2015-01-21 (×3): qty 1000

## 2015-01-21 MED ORDER — LORAZEPAM 2 MG/ML IJ SOLN: 1.0000 mg | INTRAMUSCULAR | Status: DC | PRN

## 2015-01-21 MED ORDER — PANTOPRAZOLE SODIUM 40 MG IV SOLR
40.0000 mg | Freq: Two times a day (BID) | INTRAVENOUS | Status: DC
  Administered 2015-01-21 – 2015-01-23 (×4): 40 mg via INTRAVENOUS
  Filled 2015-01-21 (×6): qty 40

## 2015-01-21 MED ORDER — METHYLPREDNISOLONE SODIUM SUCC 40 MG IJ SOLR
40.0000 mg | Freq: Two times a day (BID) | INTRAMUSCULAR | Status: DC
  Administered 2015-01-21 – 2015-01-22 (×3): 40 mg via INTRAVENOUS
  Filled 2015-01-21 (×4): qty 1

## 2015-01-21 MED ORDER — SODIUM CHLORIDE 0.9 % IV BOLUS (SEPSIS)
1000.0000 mL | Freq: Once | INTRAVENOUS | Status: AC
Start: 2015-01-21 — End: 2015-01-21
  Administered 2015-01-21: 1000 mL via INTRAVENOUS

## 2015-01-21 MED ORDER — SODIUM CHLORIDE 0.9 % IV SOLN: 250.0000 mL | INTRAVENOUS | Status: DC | PRN

## 2015-01-21 MED ORDER — POTASSIUM CHLORIDE 10 MEQ/100ML IV SOLN: 10.0000 meq | INTRAVENOUS | Status: DC

## 2015-01-21 MED ORDER — HYDRALAZINE HCL 20 MG/ML IJ SOLN: 20.0000 mg | INTRAMUSCULAR | Status: DC | PRN

## 2015-01-21 MED ORDER — CHLORHEXIDINE GLUCONATE CLOTH 2 % EX PADS
6.0000 | MEDICATED_PAD | Freq: Every day | CUTANEOUS | Status: DC
  Administered 2015-01-21: 6 via TOPICAL

## 2015-01-21 MED ORDER — LORAZEPAM 2 MG/ML IJ SOLN: 2.0000 mg | INTRAMUSCULAR | Status: DC | PRN

## 2015-01-21 MED ORDER — METOCLOPRAMIDE HCL 5 MG/ML IJ SOLN
5.0000 mg | Freq: Four times a day (QID) | INTRAMUSCULAR | Status: DC
  Administered 2015-01-21 – 2015-01-22 (×5): 5 mg via INTRAVENOUS
  Filled 2015-01-21 (×8): qty 1

## 2015-01-21 NOTE — ED Provider Notes (Signed)
CSN: [id number]     Arrival date & time 01/21/15  0335 History  This chart was scribed for Derwood Kaplan, MD by Freida Busman, ED Scribe. This patient was seen in room [facility] and the patient's care was started 3:40 AM.    Chief Complaint  Patient presents with  . Shortness of Breath     The history is provided by the patient and the EMS personnel. No language interpreter was used.     HPI Comments:  Zaara Kotlyar is a 49 y.o. female with a history of asthma, who presents to the Emergency Department via EMS complaining of severe SOB. She has been experiencing SOB for a few days but symptom worsened last night ~2300. Pt used inhaler at that time without relief. Pt was giving a breathing treatment upon EMS arrival to the pt which did not seem to help. She was given 2 more breathing treatments and placed on CPAP PTA. Pt received 15mg  albuterol, 1mg  of atrovent and 125mg  solumedrol. She notes her symptoms feel similar to past asthma exacerbations. Pt denies h/o asthma related intubation. She reports associated cough, and central chest tightness for a few days. Pt took 81 mg  ASA at home for her CP.  Pt denies fever, h/o DVT/PE, smoking history, MI/cardiac hx, recent long periods of immobilization.   Past Medical History  Diagnosis Date  . Hypertension    Past Surgical History  Procedure Laterality Date  . Abdominal hysterectomy     Family History  Problem Relation Age of Onset  . Diabetes Mother    History  Substance Use Topics  . Smoking status: Never Smoker   . Smokeless tobacco: Not on file  . Alcohol Use: No   OB History    No data available     Review of Systems  Constitutional: Negative for fever.  Respiratory: Positive for cough and shortness of breath.   Cardiovascular: Positive for chest pain.   Unable to fully obtain ROS due to Acuity of medical condition     Allergies  Penicillins  Home Medications   Prior to Admission medications   Medication  Sig Start Date End Date Taking? Authorizing Provider  albuterol (PROVENTIL HFA;VENTOLIN HFA) 108 (90 BASE) MCG/ACT inhaler Inhale 2 puffs into the lungs 2 (two) times daily.   Yes Historical Provider, MD  amLODipine (NORVASC) 10 MG tablet Take 10 mg by mouth 2 (two) times daily.   Yes Historical Provider, MD  budesonide (PULMICORT) 180 MCG/ACT inhaler Inhale 2 puffs into the lungs 2 (two) times daily.   Yes Historical Provider, MD  famotidine (PEPCID) 40 MG tablet Take 1 tablet (40 mg total) by mouth daily. 07/28/12  Yes Lesle Chris Black, NP  lisinopril-hydrochlorothiazide (PRINZIDE,ZESTORETIC) 20-25 MG per tablet Take 1 tablet by mouth daily.   Yes Historical Provider, MD  pantoprazole (PROTONIX) 40 MG tablet Take 1 tablet (40 mg total) by mouth 2 (two) times daily before a meal. 07/28/12  Yes Lesle Chris Black, NP   BP 165/91 mmHg  Pulse 110  Resp 26  SpO2 99% Physical Exam  Constitutional: She is oriented to person, place, and time. She appears well-developed and well-nourished. No distress.  HENT:  Head: Normocephalic and atraumatic.  Eyes: Conjunctivae are normal.  Neck: Normal range of motion.  Cardiovascular: Tachycardia present.   Pulmonary/Chest: She is in respiratory distress.  Poor air entry  at the bases  Abdominal: Soft. There is no tenderness.  Musculoskeletal: Normal range of motion. She exhibits no edema.  Neurological:  She is alert and oriented to person, place, and time.  Skin: Skin is warm and dry.  Psychiatric: She has a normal mood and affect.  Nursing note and vitals reviewed.   ED Course  ARTERIAL BLOOD GAS Date/Time: 01/21/2015 7:57 AM Performed by: Derwood Kaplan Authorized by: Derwood Kaplan Consent: Verbal consent obtained. Risks and benefits: risks, benefits and alternatives were discussed Consent given by: patient Required items: required blood products, implants, devices, and special equipment available Patient identity confirmed: arm band Time out:  Immediately prior to procedure a "time out" was called to verify the correct patient, procedure, equipment, support staff and site/side marked as required. Location: right radial Preparation: Patient was prepped and draped in the usual sterile fashion. Allen's test normal: yes Number of attempts: 2 Method: Single percutaneous needle puncture     DIAGNOSTIC STUDIES:  Oxygen Saturation is 100% on CPAP, normal by my interpretation.    COORDINATION OF CARE:  3:47 AM Discussed treatment plan with pt at bedside and pt agreed to plan.  Labs Review Labs Reviewed  CBC WITH DIFFERENTIAL/PLATELET - Abnormal; Notable for the following:    MCV 76.4 (*)    MCH 25.5 (*)    All other components within normal limits  BASIC METABOLIC PANEL - Abnormal; Notable for the following:    Potassium 2.6 (*)    Glucose, Bld 139 (*)    Creatinine, Ser 1.02 (*)    All other components within normal limits  I-STAT CG4 LACTIC ACID, ED - Abnormal; Notable for the following:    Lactic Acid, Venous 4.29 (*)    All other components within normal limits  I-STAT ARTERIAL BLOOD GAS, ED - Abnormal; Notable for the following:    pH, Arterial 7.493 (*)    pCO2 arterial 26.6 (*)    pO2, Arterial 115.0 (*)    All other components within normal limits  BRAIN NATRIURETIC PEPTIDE  TROPONIN I  URINALYSIS, ROUTINE W REFLEX MICROSCOPIC (NOT AT [facility])  BLOOD GAS, ARTERIAL    Imaging Review Dg Chest Port 1 View  01/21/2015   CLINICAL DATA:  Dyspnea. History of hypertension, community acquired pneumonia.  EXAM: PORTABLE CHEST - 1 VIEW  COMPARISON:  Chest radiograph January 21, 2015 at 3:54 a.m.  FINDINGS: Cardiac silhouette remains upper limits of normal in size, mediastinal silhouette is nonsuspicious. Low inspiratory portable examination with crowded vascular markings. Minimal bibasilar strandy densities without pleural effusion or focal consolidation. No pneumothorax. Large body habitus. Mild degenerative change of the  acromioclavicular joints.  IMPRESSION: Borderline cardiomegaly, bibasilar atelectasis.   Electronically Signed   By: Awilda Metro M.D.   On: 01/21/2015 06:49   Dg Chest Port 1 View  01/21/2015   CLINICAL DATA:  Dyspnea.  History of hypertension.  EXAM: PORTABLE CHEST - 1 VIEW  COMPARISON:  None.  FINDINGS: The cardiac silhouette is upper limits of normal in size. Patient is rotated to the RIGHT. Low inspiratory portable examination with bibasilar strandy densities. No pleural effusion or focal consolidation. No pneumothorax. Degenerative change of the acromioclavicular joints, similar. Large body habitus. Small bony fragments about the RIGHT acromial may reflect loose bodies, unchanged.  IMPRESSION: Borderline cardiomegaly. Bibasilar atelectasis in this low inspiratory portable examination.   Electronically Signed   By: Awilda Metro M.D.   On: 01/21/2015 04:31     EKG Interpretation None     ED ECG REPORT   Date: 01/21/2015  Rate: 116  Rhythm: sinus tachycardia  QRS Axis: normal  Intervals: normal  ST/T  Wave abnormalities: nonspecific ST/T changes  Conduction Disutrbances:none  Narrative Interpretation:   Old EKG Reviewed: changes noted  I have personally reviewed the EKG tracing and agree with the computerized printout as noted.   MDM   Final diagnoses:  Dyspnea  Acute respiratory distress  Status asthmaticus, severe persistent    CRITICAL CARE Performed by: Derwood KaplanNanavati, Costa Jha   Total critical care time: 85 minutes  Critical care time was exclusive of separately billable procedures and treating other patients.  Critical care was necessary to treat or prevent imminent or life-threatening deterioration.  Critical care was time spent personally by me on the following activities: development of treatment plan with patient and/or surrogate as well as nursing, discussions with consultants, evaluation of patient's response to treatment, examination of patient, obtaining  history from patient or surrogate, ordering and performing treatments and interventions, ordering and review of laboratory studies, ordering and review of radiographic studies, pulse oximetry and re-evaluation of patient's condition.   PT comes in with respiratory distress. Hx of asthma. No intubation hx. She has diffuse wheezing. She has no signs of CHF. CXR shows no pneumonia. 15 mg albuterol and steroids per EMS. Gave 10 mg/hr albuterol tx here - no change. Magnesium given - no change. Pt's RR is in the 30s. She has no hx of anxiety - versed given on one occasion - and it didn't change the RR. Pt is pulling good tidal volume. Pt has no hx of PE, DVT and denies any exogenous estrogen use, long distance travels or surgery in the past 6 weeks, active cancer, recent immobilization. Pt is not stable for CT PE. CXR x 2 done - no pneumonia seen.  Called CCM at 6:00 am - they will assess the patient and decide admission. I think she is at danger of fatiguing out and needing intubation if her RR continues to be this high.   Derwood KaplanAnkit Khalen Styer, MD 01/21/15 [id number]

## 2015-01-21 NOTE — Progress Notes (Signed)
Patient transported from ED to . Patient is stable and comfotable. RT will continue to monitor as needed.

## 2015-01-21 NOTE — ED Notes (Signed)
Pt arrived via EMS on C-pap c/o SOB, respiratory distress starting around 11pm pt used rescue inhaler without relief.  EMS gave  Albuterol,  Solumedrol,  Atrovent.  Pt took  Aspirin at home.  Pt has hx of Asthma and believes this is related.

## 2015-01-21 NOTE — H&P (Signed)
PULMONARY / CRITICAL CARE MEDICINE   Name: Grace Barker MRN: [id number] DOB: 01/31/1966    ADMISSION DATE:  01/21/2015 CONSULTATION DATE:  7/9  REFERRING MD :  Rhunette Croft   CHIEF COMPLAINT:  Acute resp failure/asthmatic excarnation   INITIAL PRESENTATION:  49 year old female admitted on 7/9 w/ several d h/o progressive dyspnea. On presentation was hypoxic w/ Pao2 of 115 on 30% (P/F ratio 383), w/ marked upper airway wheeze. 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   STUDIES:    SIGNIFICANT EVENTS:    HISTORY OF PRESENT ILLNESS:   49 year old AAF w/ self described h/o asthma and HTN. Presented to the ER on 7/9 w/ cc: several day h/o progressive SOB and wheeze. Endorsed + sick exposure. Wheezing worse at home, not responding to rescue SABA. Called EMS. Got  albut, solumedrol , and placed on CPAP. In ER found to be in acute distress. PO2 was 115 on 30% FIO2. Had marked accessory muscle use, paradoxical respiratory efforts and increased RR. On exam had an impressive upperairway wheeze that transmitted t/o the chest. She did seem to feel some improvement when switched from BIPAP to higher CPAP settings. On further questioning endorsed severe reflux to point of tasting this in her mouth from time to time w/ associated wet non-productive cough. She had little improvement w/ rescue BDs. PCCM asked to admit.   PAST MEDICAL HISTORY :   has a past medical history of Hypertension.  has past surgical history that includes Abdominal hysterectomy. Prior to Admission medications   Medication Sig Start Date End Date Taking? Authorizing Provider  albuterol (PROVENTIL HFA;VENTOLIN HFA) 108 (90 BASE) MCG/ACT inhaler Inhale 2 puffs into the lungs 2 (two) times daily.   Yes Historical Provider, MD  amLODipine (NORVASC) 10 MG tablet Take 10 mg by mouth 2 (two) times daily.   Yes Historical Provider, MD  budesonide (PULMICORT) 180 MCG/ACT inhaler Inhale 2 puffs into  the lungs 2 (two) times daily.   Yes Historical Provider, MD  famotidine (PEPCID) 40 MG tablet Take 1 tablet (40 mg total) by mouth daily. 07/28/12  Yes Lesle Chris Black, NP  lisinopril-hydrochlorothiazide (PRINZIDE,ZESTORETIC) 20-25 MG per tablet Take 1 tablet by mouth daily.   Yes Historical Provider, MD  pantoprazole (PROTONIX) 40 MG tablet Take 1 tablet (40 mg total) by mouth 2 (two) times daily before a meal. 07/28/12  Yes Gwenyth Bender, NP   Allergies  Allergen Reactions  . Penicillins Rash    FAMILY HISTORY:  has no family status information on file.  SOCIAL HISTORY:  reports that she has never smoked. She does not have any smokeless tobacco history on file. She reports that she does not drink alcohol.  REVIEW OF SYSTEMS:   HENT: no HA, sinus pain, + sore throat. PULM: ++ wheeze, ++ CP w/ deep breath, + wet NP cough, SOB at rest. Card: no palps, no edema, no PND. GI: + nausea. + vomiting, reports very severe reflux tasting in mouth at times. No diarrhea. GU: no urgency, hesitancy or dysuria Neuro: no falls, weakness, focal def, HAs. Endo: no hot/cold issues, wt gain/loss/excessive thirst. MS: no issues.   SUBJECTIVE:  Very SOB  VITAL SIGNS: Pulse Rate:  [103-115] 110 (07/09 0756) Resp:  [13-36] 25 (07/09 0756) BP: (156-203)/(73-116) 187/103 mmHg (07/09 0756) SpO2:  [93 %-100 %] 100 % (07/09 0756) FiO2 (%):  [30 %-40 %] 30 % (07/09 0730) HEMODYNAMICS:   VENTILATOR SETTINGS: Vent Mode:  [-] BIPAP  FiO2 (%):  [30 %-40 %] 30 % Set Rate:  [8 bmp] 8 bmp INTAKE / OUTPUT: No intake or output data in the 24 hours ending 01/21/15 0823  PHYSICAL EXAMINATION: General:  Obese AAF, very anxious, w/ increased RR, sitting upright ++ accessory muscle use  Neuro:  Awake, oriented. Moves all ext  HEENT:  Loud upper airway wheeze. Loud wet cough. Non-productive  Cardiovascular:  Tachycardia (ST on tele); no MRG Lungs:  Essentially clear w/ transmitted upper airway wheeze Abdomen:  Obese + bowel  sounds  Musculoskeletal:  intact Skin:  intact  LABS:  CBC  Recent Labs Lab 01/21/15 0404  WBC 6.0  HGB 12.0  HCT 36.0  PLT 242   Coag's No results for input(s): APTT, INR in the last 168 hours. BMET  Recent Labs Lab 01/21/15 0404  NA 139  K 2.6*  CL 105  CO2 22  BUN 9  CREATININE 1.02*  GLUCOSE 139*   Electrolytes  Recent Labs Lab 01/21/15 0404  CALCIUM 9.7   Sepsis Markers  Recent Labs Lab 01/21/15 0438  LATICACIDVEN 4.29*   ABG  Recent Labs Lab 01/21/15 0508  PHART 7.493*  PCO2ART 26.6*  PO2ART 115.0*   Liver Enzymes No results for input(s): AST, ALT, ALKPHOS, BILITOT, ALBUMIN in the last 168 hours. Cardiac Enzymes  Recent Labs Lab 01/21/15 0404  TROPONINI <0.03   Glucose No results for input(s): GLUCAP in the last 168 hours.  Imaging Dg Chest Port 1 View  01/21/2015   CLINICAL DATA:  Dyspnea. History of hypertension, community acquired pneumonia.  EXAM: PORTABLE CHEST - 1 VIEW  COMPARISON:  Chest radiograph January 21, 2015 at 3:54 a.m.  FINDINGS: Cardiac silhouette remains upper limits of normal in size, mediastinal silhouette is nonsuspicious. Low inspiratory portable examination with crowded vascular markings. Minimal bibasilar strandy densities without pleural effusion or focal consolidation. No pneumothorax. Large body habitus. Mild degenerative change of the acromioclavicular joints.  IMPRESSION: Borderline cardiomegaly, bibasilar atelectasis.   Electronically Signed   By: Awilda Metro M.D.   On: 01/21/2015 06:49   Dg Chest Port 1 View  01/21/2015   CLINICAL DATA:  Dyspnea.  History of hypertension.  EXAM: PORTABLE CHEST - 1 VIEW  COMPARISON:  None.  FINDINGS: The cardiac silhouette is upper limits of normal in size. Patient is rotated to the RIGHT. Low inspiratory portable examination with bibasilar strandy densities. No pleural effusion or focal consolidation. No pneumothorax. Degenerative change of the acromioclavicular joints,  similar. Large body habitus. Small bony fragments about the RIGHT acromial may reflect loose bodies, unchanged.  IMPRESSION: Borderline cardiomegaly. Bibasilar atelectasis in this low inspiratory portable examination.   Electronically Signed   By: Awilda Metro M.D.   On: 01/21/2015 04:31     ASSESSMENT / PLAN:  PULMONARY OETT A: Acute hypoxic respiratory failure  Respiratory alkalosis  Favor decompensated Vocal cord dysfunction over asthmatic exacerbation.   States has had very severe reflux. Also recent possible resp viral exposure. Almost no wheeze noted in lung and severe upper airway wheezing still not totally overcome w/ CPAP  P:   Admit to ICU CPAP for now. (she felt better w/ this over BIPAP) Scheduled brovana/budesonide Aggressive GERD RX Aggressive anxiolytic rx Add low dose antihistamine  Low dose systemic steroids  Ck uds High risk for intubation   CARDIOVASCULAR CVL A:  HTN P:  PRN hydralazine for SP >170 Tele   RENAL A:   Severe hypokalemia  P:   Replace and recheck  GASTROINTESTINAL  A:   Severe reflux Nausea and vomiting  P:   PPI and H2 blockade scheduled Add low dose reglan scheduled NPO except meds  HEMATOLOGIC A:   Microcytic anemia  P:  Ck iron panel  [place] heparin  F/u cbc am Transfuse per usual ICU protocol   INFECTIOUS A:  No evidence of infection  P:   PCT algorithm Trend fever and WBC curve     ENDOCRINE A:   Hyperglycemia ->anticipate will worsen w/ systemic steroids  P:   ssi  NEUROLOGIC A:   Anxiety  P:   RASS goal: -1 PRN ativan    FAMILY  - Updates: pending   - Inter-disciplinary family meet or Palliative Care meeting due by:  7/16    TODAY'S SUMMARY: favor VCD>asthma. Will treat as both. Doubt infected. Hold off on abx. Will check PCT. Lactic acid is d/t work of breathing. Tachycardia d/t CABs and anxiety. She is NOT septic. Will admit to ICU. Will cont CPAP to stent upper airways. Risk of intubation  high due to severe upper airway wheezing. Will check UDS, rx reflux, add antihistamine and rx her anxiety. Will repeat lactic acid as well.    Simonne MartinetPeter E Babcock ACNP-BC [facility] Pulmonary/Critical Care Pager # [phone] OR # [phone] if no answer   01/21/2015, 8:23 AM   PCCM ATTENDING: I have reviewed pt's initial presentation and database in detail and fully evaluated the pt with ACNP Tanja PortBabcock. The above assessment and plan was formulated under my direction.  In summary: Acute respiratory distress H/O asthma Recent stressors This appears to be VCD > acute asthma exacerbation Her exam reveals prominent pseudo-wheeze and not true bronchial wheezes Her resp distress improved substantially with lorazepam The lactic acidosis is likely due to labored breathing and is not indicative of sepsis  We will treat with modest dose systemic steroids, nebulized steroids, nebulized bronchodilators, PRN NPPV, low dose anxiolytics NPO for now until risk of intubation sufficiently subsides Will cover with SSI while on systemic steroids   Billy Fischeravid Hansini Clodfelter, MD;  PCCM service; Mobile [phone]

## 2015-01-21 NOTE — ED Notes (Signed)
Potassium 2.6 reported to Dr Rhunette CroftNanavati

## 2015-01-21 NOTE — ED Notes (Addendum)
Admitting MD at bedside. Changed the bipap settings to NIV PS 30% O2, 12 PEEP, 0 PS above PEEP.

## 2015-01-22 ENCOUNTER — Encounter (HOSPITAL_COMMUNITY): Payer: Self-pay

## 2015-01-22 LAB — BASIC METABOLIC PANEL
ANION GAP: 6 (ref 5–15)
CHLORIDE: 111 mmol/L (ref 101–111)
CO2: 21 mmol/L — ABNORMAL LOW (ref 22–32)
Calcium: 9.3 mg/dL (ref 8.9–10.3)
Creatinine, Ser: 0.65 mg/dL (ref 0.44–1.00)
GFR calc non Af Amer: >60 mL/min (ref >60)
Glucose, Bld: 124 mg/dL — ABNORMAL HIGH (ref 65–99)
POTASSIUM: 4.6 mmol/L (ref 3.5–5.1)
SODIUM: 138 mmol/L (ref 135–145)

## 2015-01-22 LAB — GLUCOSE, CAPILLARY
Glucose-Capillary: 122 mg/dL — ABNORMAL HIGH (ref 65–99)
Glucose-Capillary: 150 mg/dL — ABNORMAL HIGH (ref 65–99)

## 2015-01-22 LAB — CBC
HCT: 36.9 % (ref 36.0–46.0)
Hemoglobin: 12.2 g/dL (ref 12.0–15.0)
MCH: 25.8 pg — ABNORMAL LOW (ref 26.0–34.0)
MCHC: 33.1 g/dL (ref 30.0–36.0)
MCV: 78 fL (ref 78.0–100.0)
PLATELETS: 240 10*3/uL (ref 150–400)
RBC: 4.73 MIL/uL (ref 3.87–5.11)
RDW: 15.7 % — AB (ref 11.5–15.5)
WBC: 14.1 10*3/uL — AB (ref 4.0–10.5)

## 2015-01-22 LAB — PROCALCITONIN

## 2015-01-22 MED ORDER — ARFORMOTEROL TARTRATE 15 MCG/2ML IN NEBU
15.0000 ug | INHALATION_SOLUTION | Freq: Two times a day (BID) | RESPIRATORY_TRACT | Status: DC
  Administered 2015-01-22 – 2015-01-23 (×2): 15 ug via RESPIRATORY_TRACT
  Filled 2015-01-22 (×4): qty 2

## 2015-01-22 MED ORDER — BUDESONIDE 0.25 MG/2ML IN SUSP
0.2500 mg | Freq: Two times a day (BID) | RESPIRATORY_TRACT | Status: DC
  Administered 2015-01-22 – 2015-01-23 (×2): 0.25 mg via RESPIRATORY_TRACT
  Filled 2015-01-22 (×4): qty 2

## 2015-01-22 MED ORDER — METOCLOPRAMIDE HCL 5 MG PO TABS
5.0000 mg | ORAL_TABLET | Freq: Three times a day (TID) | ORAL | Status: DC
  Administered 2015-01-22 – 2015-01-23 (×5): 5 mg via ORAL
  Filled 2015-01-22 (×8): qty 1

## 2015-01-22 MED ORDER — AMLODIPINE BESYLATE 10 MG PO TABS
10.0000 mg | ORAL_TABLET | Freq: Every day | ORAL | Status: DC
  Administered 2015-01-22 – 2015-01-23 (×2): 10 mg via ORAL
  Filled 2015-01-22 (×2): qty 1

## 2015-01-22 NOTE — Progress Notes (Signed)
Report given to [facility], pt able to transport off monitor via wheel chair.  Victorino DikeLeslie Ravon Mortellaro RN

## 2015-01-22 NOTE — Progress Notes (Signed)
RT Note: Pt has bipap order in worklist but no actual bipap order.  Bipap was d/c'd and pt is in not distress.  Rt will continue to monitor.

## 2015-01-22 NOTE — Progress Notes (Signed)
Pt. Arrived to the unit via wheelchair. Pt. Is alert and oriented with no signs of distress noted. Pt. Vitals appear stable with no skin issues noted. Pt. Ambulated from wheelchair to the bed and tolerated well. Educated pt. On use of staff numbers, room telephone and call bell. Call light within reach. Orders released. No further needs noted at this time. 

## 2015-01-22 NOTE — Progress Notes (Addendum)
PULMONARY / CRITICAL CARE MEDICINE   Name: Grace Barker MRN: [id number] DOB: July 26, 1965    ADMISSION DATE:  01/21/2015  CHIEF COMPLAINT:  Resp distress, h/o asthma   INITIAL PRESENTATION:  49 year old female admitted on 7/9 w/ several d h/o progressive dyspnea. On presentation was hypoxic w/ Pao2 of 115 on 30% (P/F ratio 383), w/ marked upper airway wheeze. 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   STUDIES:  Esophagram 7/10>>>  SUBJECTIVE:  Much improved.   VITAL SIGNS: Temp:  [97.4 F (36.3 C)-98 F (36.7 C)] 97.4 F (36.3 C) (07/10 0739) Pulse Rate:  [74-109] 85 (07/10 0900) Resp:  [0-32] 28 (07/10 0900) BP: (110-175)/(50-94) 147/81 mmHg (07/10 0900) SpO2:  [95 %-100 %] 98 % (07/10 0900) FiO2 (%):  [30 %] 30 % (07/09 0945) Room air  HEMODYNAMICS:   VENTILATOR SETTINGS: Vent Mode:  [-] BIPAP FiO2 (%):  [30 %] 30 % Set Rate:  [6 bmp] 6 bmp PEEP:  [5 cmH20] 5 cmH20 INTAKE / OUTPUT:  Intake/Output Summary (Last 24 hours) at 01/22/15 [id number] Last data filed at 01/22/15 0900  Gross per 24 hour  Intake   2250 ml  Output   1275 ml  Net    975 ml    PHYSICAL EXAMINATION: General:  Obese AAF, calm. Looks much better. No distress.  Neuro:  Awake, oriented. Moves all ext completely oriented  HEENT:  decreased upper airway wheeze.  Cardiovascular:  Tachycardia (ST on tele); no MRG Lungs:  Essentially clear Abdomen:  Obese + bowel sounds  Musculoskeletal:  intact Skin:  intact  LABS:  CBC  Recent Labs Lab 01/21/15 0404 01/22/15 0304  WBC 6.0 14.1*  HGB 12.0 12.2  HCT 36.0 36.9  PLT 242 240   Coag's No results for input(s): APTT, INR in the last 168 hours. BMET  Recent Labs Lab 01/21/15 0404 01/21/15 1615 01/22/15 0304  NA 139 139 138  K 2.6* 4.1 4.6  CL 105 110 111  CO2 22 21* 21*  BUN 9 7 <5*  CREATININE 1.02* 0.75 0.65  GLUCOSE 139* 135* 124*   Electrolytes  Recent Labs Lab 01/21/15 0404 01/21/15 1615  01/22/15 0304  CALCIUM 9.7 9.4 9.3   Sepsis Markers  Recent Labs Lab 01/21/15 0438 01/21/15 0836 01/22/15 0304  LATICACIDVEN 4.29* 5.8*  --   PROCALCITON  --  <0.10 <0.10   ABG  Recent Labs Lab 01/21/15 0508  PHART 7.493*  PCO2ART 26.6*  PO2ART 115.0*   Liver Enzymes No results for input(s): AST, ALT, ALKPHOS, BILITOT, ALBUMIN in the last 168 hours. Cardiac Enzymes  Recent Labs Lab 01/21/15 0404 01/21/15 0836  TROPONINI <0.03 <0.03   Glucose  Recent Labs Lab 01/21/15 0931 01/21/15 1125 01/21/15 1546 01/21/15 2004 01/22/15 0017 01/22/15 0403  GLUCAP 152* 162* 134* 118* 150* 122*    Imaging No results found.   ASSESSMENT / PLAN:  Acute hypoxic respiratory failure (resolved) in setting of decompensated Vocal cord dysfunction likely exacerbated by severe gastric reflux  Plan:   OOB Add diet  Aggressive GERD RX Aggressive anxiolytic rx D/c steroids and antihistamines.  Esophogram to eval reflux and stricture  Change BD to PRN No more Ace-I  HTN Plan Switch ace i to norvasc      Microcytic anemia  P:  Ck iron panel  [place] heparin  F/u cbc am   Will be ready for dc in am.   Simonne MartinetPeter E Babcock ACNP-BC [facility] Pulmonary/Critical Care Pager # [phone]  OR # [phone] if no answer   PCCM ATTENDING: I have seen in conjunction with ACNP Tanja Port and reviewed pt's hospital database in detail.  The above assessment and plan was formulated under my direction.  In summary: Much improved overall Still believe VCD > asthma Transfer to med-surg today Likely DC home 7/11 Med regimen has been modified to reflect her improvement   Billy Fischer, MD;  PCCM service; Mobile [phone]

## 2015-01-23 ENCOUNTER — Inpatient Hospital Stay (HOSPITAL_COMMUNITY): Payer: BLUE CROSS/BLUE SHIELD

## 2015-01-23 DIAGNOSIS — R0603 Acute respiratory distress: Secondary | ICD-10-CM | POA: Insufficient documentation

## 2015-01-23 DIAGNOSIS — K219 Gastro-esophageal reflux disease without esophagitis: Secondary | ICD-10-CM

## 2015-01-23 DIAGNOSIS — J45901 Unspecified asthma with (acute) exacerbation: Secondary | ICD-10-CM

## 2015-01-23 DIAGNOSIS — J383 Other diseases of vocal cords: Secondary | ICD-10-CM

## 2015-01-23 DIAGNOSIS — J80 Acute respiratory distress syndrome: Secondary | ICD-10-CM

## 2015-01-23 LAB — GLUCOSE, CAPILLARY: Glucose-Capillary: 110 mg/dL — ABNORMAL HIGH (ref 65–99)

## 2015-01-23 MED ORDER — PANTOPRAZOLE SODIUM 40 MG PO TBEC: 40.0000 mg | DELAYED_RELEASE_TABLET | Freq: Two times a day (BID) | ORAL | Status: AC

## 2015-01-23 MED ORDER — METOCLOPRAMIDE HCL 5 MG PO TABS: 5.0000 mg | ORAL_TABLET | Freq: Three times a day (TID) | ORAL | Status: AC

## 2015-01-23 MED ORDER — PANTOPRAZOLE SODIUM 40 MG PO TBEC: 40.0000 mg | DELAYED_RELEASE_TABLET | Freq: Two times a day (BID) | ORAL | Status: DC

## 2015-01-23 MED ORDER — ALPRAZOLAM 0.25 MG PO TABS: 0.2500 mg | ORAL_TABLET | Freq: Three times a day (TID) | ORAL | Status: AC | PRN

## 2015-01-23 NOTE — Progress Notes (Signed)
Grace Barker to be D/C'd Home per MD order.  Discussed with the patient and all questions fully answered.  VSS, Skin clean, dry and intact without evidence of skin break down, no evidence of skin tears noted. IV catheter discontinued intact. Site without signs and symptoms of complications. Dressing and pressure applied.  An After Visit Summary was printed and given to the patient. Patient received prescription.  D/c education completed with patient/family including follow up instructions, medication list, d/c activities limitations if indicated, with other d/c instructions as indicated by MD - patient able to verbalize understanding, all questions fully answered.   Patient instructed to return to ED, call 911, or call MD for any changes in condition.   Patient escorted via WC, and D/C home via private auto.  Grace Barker, Jamol Ginyard F 01/23/2015 12:42 PM

## 2015-01-23 NOTE — Discharge Summary (Signed)
Physician Discharge Summary       Patient ID: Grace Barker MRN: [id number] DOB/AGE: 48-Feb-1967 48 y.o.  Admit date: 01/21/2015 Discharge date: 01/23/2015  Discharge Diagnoses:  Principal Problem:   Acute respiratory failure Active Problems:   Vocal cord dysfunction   Asthma with exacerbation   GERD (gastroesophageal reflux disease)   History of Present Illness: 49 year old female w/ self described h/o asthma and HTN. Presented to the ER on 7/9 w/ cc: several day h/o progressive SOB and wheeze. Endorsed + sick exposure. Wheezing worse at home, not responding to rescue SABA. Called EMS. Got  albut, solumedrol , and placed on CPAP. In ER found to be in acute distress. PO2 was 115 on 30% FIO2. Had marked accessory muscle use, paradoxical respiratory efforts and increased RR. On exam had an impressive upperairway wheeze that transmitted t/o the chest. She did seem to feel some improvement when switched from BIPAP to higher CPAP settings. On further questioning endorsed severe reflux to point of tasting this in her mouth from time to time w/ associated wet non-productive cough. She had little improvement w/ rescue BDs. She was admitted to the [facility] service.   Hospital Course:  She was admitted to the ICU for close observation and treated for both decompensated vocal cord dysfunction and acute asthma exacerbation, the former having the highest suspicion. She was placed on CPAP with subjective improvement of respiratory distress. She was also started on scheduled bronchodilators and inhaled steroids. It was felt as though GERD played a large role in the decompensation of her VCD so aggressive tx of this was initiated with BID protonix. ACE-i was also discontinued in favor of CCB amlodipine. 7/10 she was much improved after the initiation of these interventions. She was no longer requiring CPAP. Underwent esophogram 7/11 AM which showed Small sliding-type hiatal hernia but no GE reflux  demonstrated, normal esophageal motility. She was also deemed a candidate for discharge 7/11.     Discharge Plan by active problems   Acute hypoxic respiratory failure (resolved) in setting of decompensated Vocal cord dysfunction likely exacerbated by severe gastric reflux  - Aggressive GERD RX (initiate BID protonix, reglan) - Start alprazolam 0.25mg  TID PRN - Resume home inhalers (albuterol and budesonide) - Esophogram showed small hiatal hernia, but no evidence of reflux - ACE-I d/c'd. Started amlodipine.   HTN - Switched ACE-i to amlodipine  Microcytic anemia  - HBG wnl prior to DC - Follow up with PCP  Small hiatal hernia - Follow up with PCP  Significant Hospital tests/ studies  7/11 Esophogram > Small sliding-type hiatal hernia but no GE reflux demonstrated. Normal esophageal motility.  Consults: none  Discharge Exam: BP 140/69 mmHg  Pulse 85  Temp(Src) 98 F (36.7 C) (Oral)  Resp 18  Wt 110.8 kg (244 lb 4.3 oz)  SpO2 99%  PHYSICAL EXAMINATION: General: Obese AAF, calm. No distress.  Neuro: Awake, oriented. Moves all ext completely oriented  HEENT: Upper airway wheeze absent  Cardiovascular: NSR now; no MRG Lungs: Essentially clear Abdomen: Obese + bowel sounds  Musculoskeletal: intact Skin: Intact  Labs at discharge Lab Results  Component Value Date   CREATININE 0.65 01/22/2015   BUN <5* 01/22/2015   NA 138 01/22/2015   K 4.6 01/22/2015   CL 111 01/22/2015   CO2 21* 01/22/2015   Lab Results  Component Value Date   WBC 14.1* 01/22/2015   HGB 12.2 01/22/2015   HCT 36.9 01/22/2015   MCV 78.0 01/22/2015   PLT  240 01/22/2015   No results found for: ALT, AST, GGT, ALKPHOS, BILITOT No results found for: INR, PROTIME  Current radiology studies Dg Esophagus  01/23/2015   CLINICAL DATA:  GE reflux and difficulty swallowing.  EXAM: ESOPHOGRAM / BARIUM SWALLOW / BARIUM TABLET STUDY  TECHNIQUE: Combined double contrast and single contrast  examination performed using effervescent crystals, thick barium liquid, and thin barium liquid. The patient was observed with fluoroscopy swallowing a 13 mm barium sulphate tablet.  FLUOROSCOPY TIME:  Radiation Exposure Index (as provided by the fluoroscopic device):  If the device does not provide the exposure index:  Fluoroscopy Time:  1 min and 48 seconds  Number of Acquired Images:  COMPARISON:  None.  FINDINGS: Initial barium swallows demonstrate normal pharyngeal motion with swallowing. No laryngeal penetration or aspiration. No upper esophageal webs, strictures or diverticuli. Moderate spurring changes are noted at C5-6 and C6-7 with mild impression on the posterior esophagus.  Normal esophageal motility. No intrinsic or extrinsic lesions are identified. There is a small sliding-type hiatal hernia. GE reflux was not demonstrated. The 13 mm barium pill passed into the stomach without difficulty.  IMPRESSION: Small sliding-type hiatal hernia but no GE reflux demonstrated.  Normal esophageal motility.   Electronically Signed   By: Rudie MeyerP.  Gallerani M.D.   On: 01/23/2015 11:57    Disposition:  01-Home or Self Care      Discharge Instructions    Call MD for:  difficulty breathing, headache or visual disturbances    Complete by:  As directed      Diet general    Complete by:  As directed      Increase activity slowly    Complete by:  As directed             Medication List    STOP taking these medications        famotidine 40 MG tablet  Commonly known as:  PEPCID     lisinopril-hydrochlorothiazide 20-25 MG per tablet  Commonly known as:  PRINZIDE,ZESTORETIC      TAKE these medications        albuterol 108 (90 BASE) MCG/ACT inhaler  Commonly known as:  PROVENTIL HFA;VENTOLIN HFA  Inhale 2 puffs into the lungs 2 (two) times daily.     ALPRAZolam 0.25 MG tablet  Commonly known as:  XANAX  Take 1 tablet (0.25 mg total) by mouth 3 (three) times daily as needed for anxiety.      amLODipine 10 MG tablet  Commonly known as:  NORVASC  Take 10 mg by mouth 2 (two) times daily.     budesonide 180 MCG/ACT inhaler  Commonly known as:  PULMICORT  Inhale 2 puffs into the lungs 2 (two) times daily.     metoCLOPramide 5 MG tablet  Commonly known as:  REGLAN  Take 1 tablet (5 mg total) by mouth 4 (four) times daily -  before meals and at bedtime.     pantoprazole 40 MG tablet  Commonly known as:  PROTONIX  Take 1 tablet (40 mg total) by mouth 2 (two) times daily.       Follow-up Information    Follow up with Farris HasMORROW, AARON, MD On 01/30/2015.   Specialty:  Family Medicine   Why:  3:30 pm for hospital follow up   Contact information:   [address] [place] [address] [phone]       Follow up with Sandrea HughsMichael Wert, MD On 01/31/2015.   Specialty:  Pulmonary Disease  Why:  2:45 PM, [facility]   Contact information:   [address] [place] [address] [phone]       Discharged Condition: good  Greater than 30 minutes of time have been dedicated to discharge assessment, planning and discharge instructions.   Signed: Joneen Roach, AGACNP-BC [facility] Pager [phone] or [phone]  01/23/2015 12:22 PM  Levy Pupa, MD, PhD 01/23/2015, 1:36 PM  Pulmonary and Critical Care [phone] or if no answer [phone]

## 2015-01-31 ENCOUNTER — Inpatient Hospital Stay: Payer: BLUE CROSS/BLUE SHIELD | Admitting: Internal Medicine

## 2015-02-09 ENCOUNTER — Inpatient Hospital Stay: Payer: BLUE CROSS/BLUE SHIELD | Admitting: Internal Medicine

## 2017-07-02 IMAGING — RF DG ESOPHAGUS
7 of 8 series · 12 of 17 positions shown · non-contrast
Comparison: None.

CLINICAL DATA: GE reflux and difficulty swallowing.

EXAM:
ESOPHOGRAM / BARIUM SWALLOW / BARIUM TABLET STUDY
TECHNIQUE: Combined double contrast and single contrast examination performed
using effervescent crystals, thick barium liquid, and thin barium
liquid. The patient was observed with fluoroscopy swallowing a 13 mm
barium sulphate tablet.
FLUOROSCOPY TIME:  Radiation Exposure Index (as provided by the
fluoroscopic device):
If the device does not provide the exposure index:
Fluoroscopy Time:  1 min and 48 seconds
Number of Acquired Images:

[Series 1: cp_standard · 0.34mm/px · 3 of 68 frames shown (1 of 7)]
[frame 11/68]
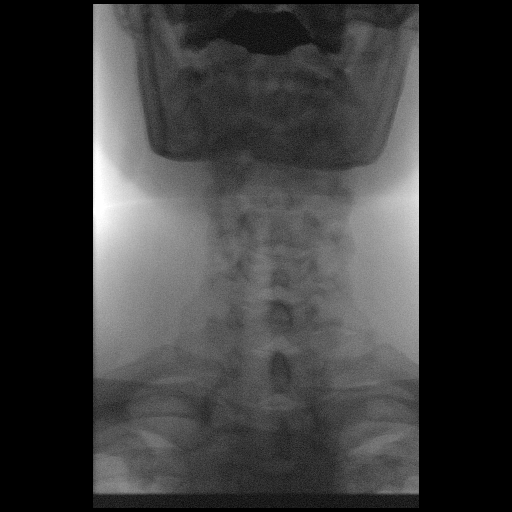
[frame 39/68]
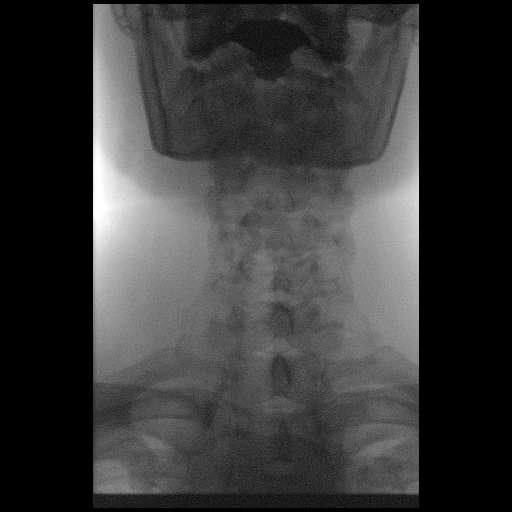
[frame 58/68]
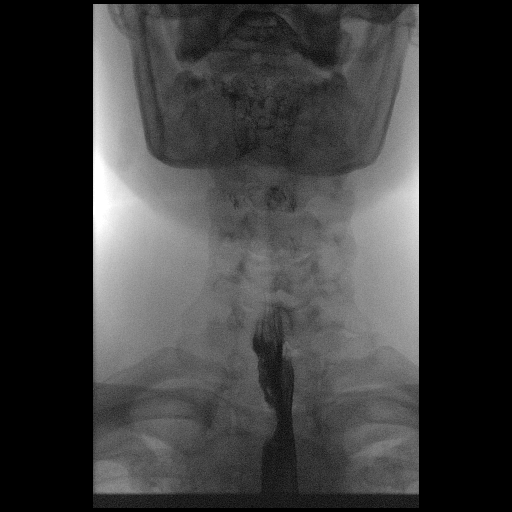

[Series 2: cp_standard · 0.34mm/px · 3 of 47 frames shown (2 of 7)]
[frame 8/47]
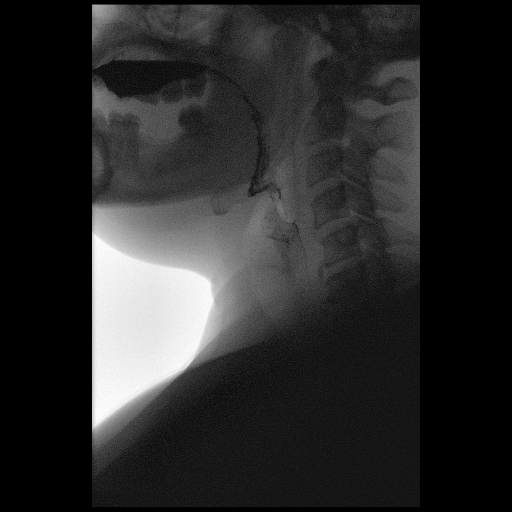
[frame 27/47]
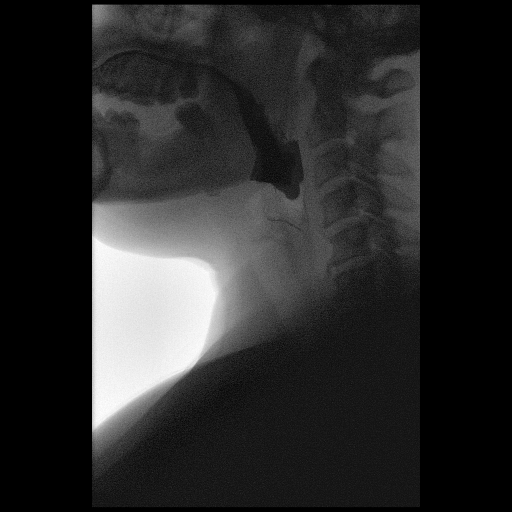
[frame 40/47]
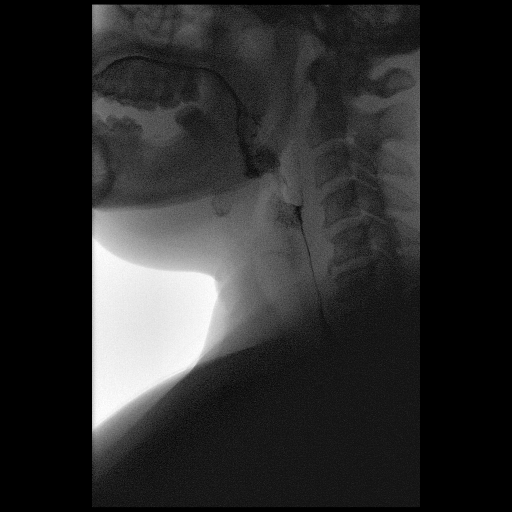

[Series 3: cp_standard · 0.35mm/px · 2 of 133 frames shown (3 of 7)]
[frame 20/133]
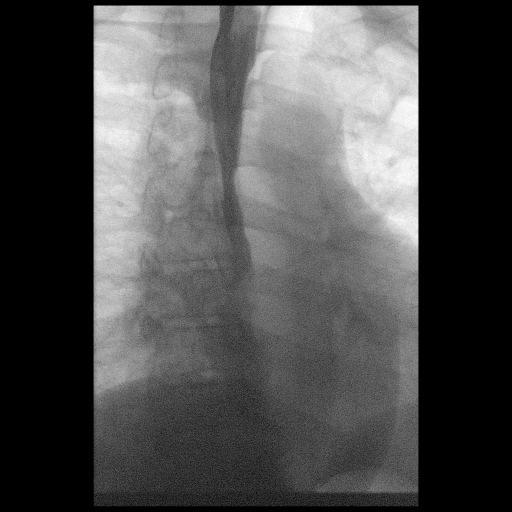
[frame 67/133]
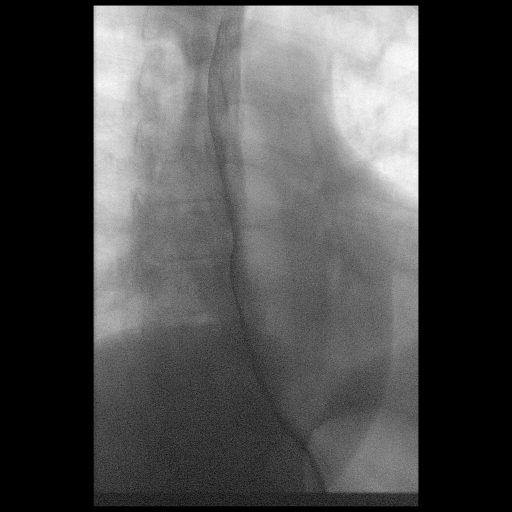

[Series 4: cp_standard · 0.19mm/px · 1 of 1 slices shown (4 of 7)]
[im 1/1]
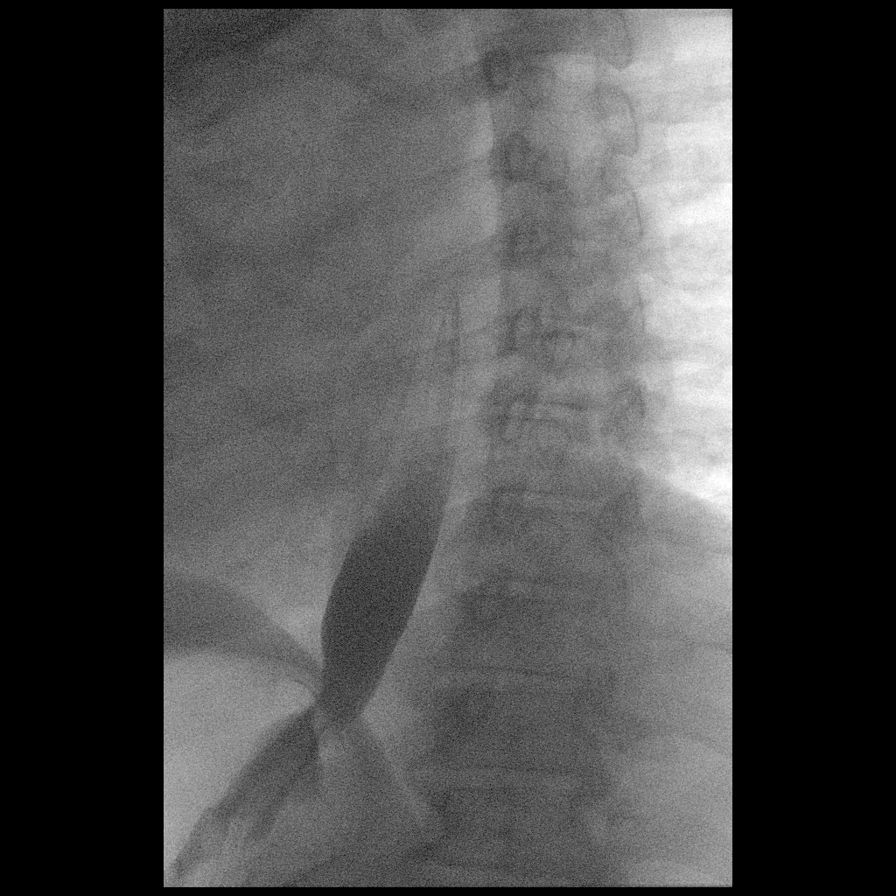

[Series 5: cp_standard · 0.19mm/px · 1 of 1 slices shown (5 of 7)]
[im 1/1]
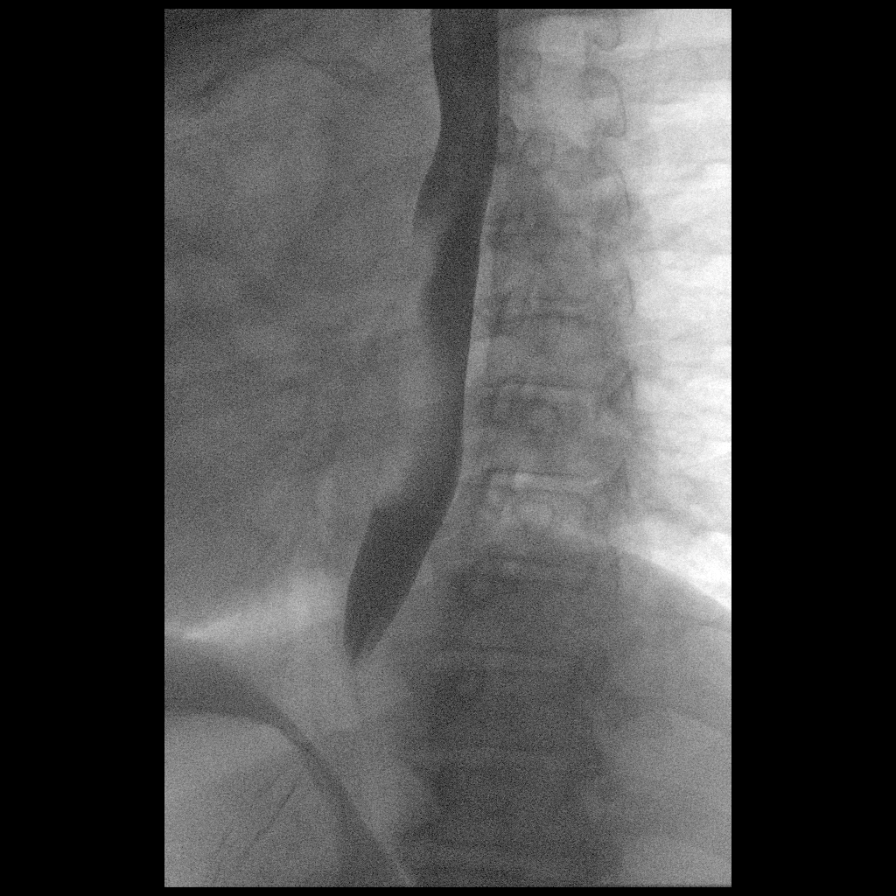

[Series 6: cp_standard · 0.19mm/px · 1 of 1 slices shown (6 of 7)]
[im 1/1]
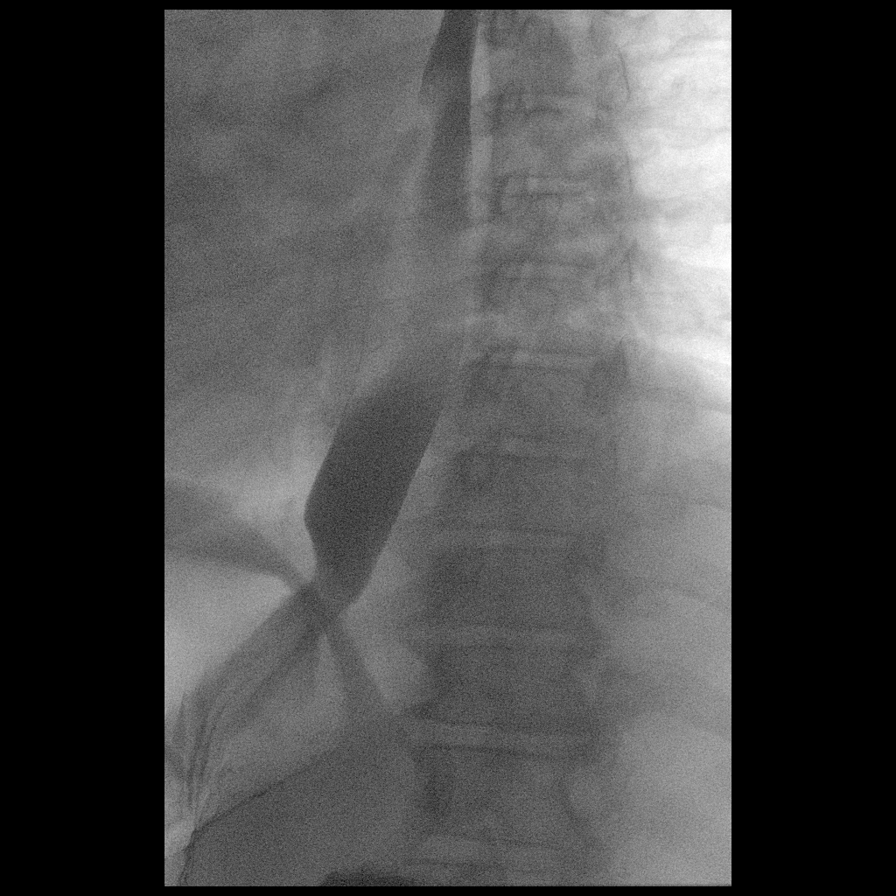

[Series 8: cp_standard · 0.19mm/px · 1 of 1 slices shown (7 of 7)]
[im 1/1]
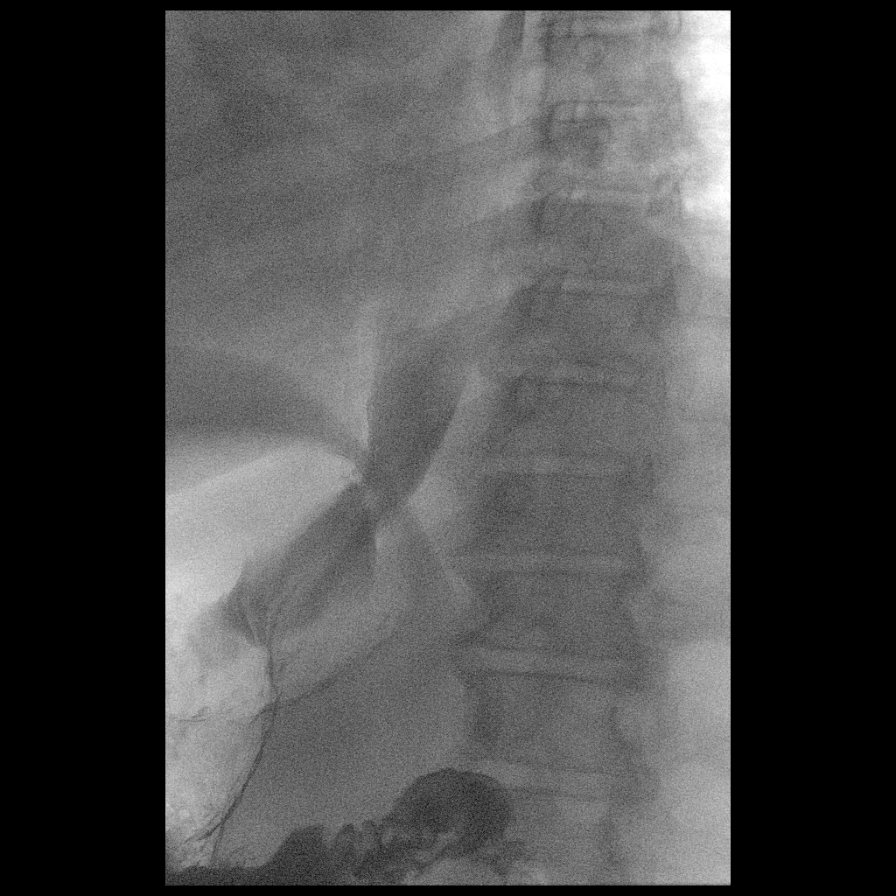

[12 of 17 positions shown; findings below may reference images not displayed]

FINDINGS: Initial barium swallows demonstrate normal pharyngeal motion with
swallowing. No laryngeal penetration or aspiration. No upper
esophageal webs, strictures or diverticuli. Moderate spurring
changes are noted at C5-6 and C6-7 with mild impression on the
posterior esophagus.

Normal esophageal motility. No intrinsic or extrinsic lesions are
identified. There is a small sliding-type hiatal hernia. GE reflux
was not demonstrated. The 13 mm barium pill passed into the stomach
without difficulty.
IMPRESSION: Small sliding-type hiatal hernia but no GE reflux demonstrated.

Normal esophageal motility.
